# Patient Record
Sex: Male | Born: 1935
Health system: Southern US, Community
[De-identification: ages and names within clinical notes are randomized; demographics above are authoritative.]

## PROBLEM LIST (undated history)

## (undated) DIAGNOSIS — IMO0002 Reserved for concepts with insufficient information to code with codable children: Secondary | ICD-10-CM

## (undated) DIAGNOSIS — H9190 Unspecified hearing loss, unspecified ear: Secondary | ICD-10-CM

## (undated) DIAGNOSIS — IMO0001 Reserved for inherently not codable concepts without codable children: Secondary | ICD-10-CM

## (undated) DIAGNOSIS — N183 Chronic kidney disease, stage 3 (moderate): Secondary | ICD-10-CM

## (undated) DIAGNOSIS — R911 Solitary pulmonary nodule: Secondary | ICD-10-CM

## (undated) DIAGNOSIS — N200 Calculus of kidney: Secondary | ICD-10-CM

## (undated) DIAGNOSIS — C44209 Unspecified malignant neoplasm of skin of left ear and external auricular canal: Secondary | ICD-10-CM

## (undated) DIAGNOSIS — H3530 Unspecified macular degeneration: Secondary | ICD-10-CM

## (undated) DIAGNOSIS — I10 Essential (primary) hypertension: Secondary | ICD-10-CM

## (undated) DIAGNOSIS — J449 Chronic obstructive pulmonary disease, unspecified: Secondary | ICD-10-CM

## (undated) DIAGNOSIS — N400 Enlarged prostate without lower urinary tract symptoms: Secondary | ICD-10-CM

## (undated) HISTORY — DX: Chronic kidney disease, stage 3 (moderate): N18.3

## (undated) HISTORY — DX: Chronic obstructive pulmonary disease, unspecified: J44.9

## (undated) HISTORY — DX: Solitary pulmonary nodule: R91.1

## (undated) HISTORY — DX: Unspecified malignant neoplasm of skin of left ear and external auricular canal: C44.209

## (undated) HISTORY — DX: Reserved for inherently not codable concepts without codable children: IMO0001

## (undated) HISTORY — PX: CATARACT EXTRACTION, BILATERAL: SHX1313

## (undated) HISTORY — DX: Calculus of kidney: N20.0

## (undated) HISTORY — PX: OTHER SURGICAL HISTORY: SHX169

## (undated) HISTORY — DX: Reserved for concepts with insufficient information to code with codable children: IMO0002

## (undated) HISTORY — DX: Unspecified hearing loss, unspecified ear: H91.90

## (undated) HISTORY — DX: Essential (primary) hypertension: I10

## (undated) HISTORY — DX: Benign prostatic hyperplasia without lower urinary tract symptoms: N40.0

## (undated) HISTORY — DX: Unspecified macular degeneration: H35.30

---

## 2000-07-14 HISTORY — PX: CYSTECTOMY: SUR359

## 2001-07-14 HISTORY — PX: PATELLA FRACTURE SURGERY: SHX735

## 2003-01-12 ENCOUNTER — Encounter: Payer: Self-pay | Admitting: Family Medicine

## 2004-02-12 ENCOUNTER — Encounter: Payer: Self-pay | Admitting: Family Medicine

## 2004-06-11 ENCOUNTER — Ambulatory Visit: Payer: Self-pay | Admitting: Family Medicine

## 2004-06-13 ENCOUNTER — Ambulatory Visit: Payer: Self-pay | Admitting: Family Medicine

## 2004-09-12 ENCOUNTER — Ambulatory Visit: Payer: Self-pay | Admitting: Family Medicine

## 2004-12-12 ENCOUNTER — Encounter: Payer: Self-pay | Admitting: Family Medicine

## 2004-12-12 LAB — CONVERTED CEMR LAB: PSA: 1.37 ng/mL

## 2004-12-13 ENCOUNTER — Ambulatory Visit: Payer: Self-pay | Admitting: Family Medicine

## 2004-12-24 ENCOUNTER — Ambulatory Visit: Payer: Self-pay | Admitting: Family Medicine

## 2004-12-28 ENCOUNTER — Inpatient Hospital Stay (HOSPITAL_COMMUNITY): Admission: EM | Admit: 2004-12-28 | Discharge: 2005-01-02 | Payer: Self-pay | Admitting: Emergency Medicine

## 2004-12-29 ENCOUNTER — Ambulatory Visit: Payer: Self-pay | Admitting: Endocrinology

## 2004-12-29 HISTORY — PX: OTHER SURGICAL HISTORY: SHX169

## 2005-01-01 ENCOUNTER — Ambulatory Visit: Payer: Self-pay | Admitting: Cardiology

## 2005-01-01 ENCOUNTER — Encounter: Payer: Self-pay | Admitting: Cardiology

## 2005-01-03 ENCOUNTER — Emergency Department (HOSPITAL_COMMUNITY): Admission: EM | Admit: 2005-01-03 | Discharge: 2005-01-03 | Payer: Self-pay | Admitting: Emergency Medicine

## 2005-01-04 ENCOUNTER — Emergency Department (HOSPITAL_COMMUNITY): Admission: EM | Admit: 2005-01-04 | Discharge: 2005-01-04 | Payer: Self-pay | Admitting: Emergency Medicine

## 2005-01-24 HISTORY — PX: TRANSURETHRAL RESECTION OF PROSTATE: SHX73

## 2005-01-27 ENCOUNTER — Encounter (INDEPENDENT_AMBULATORY_CARE_PROVIDER_SITE_OTHER): Payer: Self-pay | Admitting: *Deleted

## 2005-01-27 ENCOUNTER — Inpatient Hospital Stay (HOSPITAL_COMMUNITY): Admission: RE | Admit: 2005-01-27 | Discharge: 2005-01-29 | Payer: Self-pay | Admitting: Urology

## 2005-02-08 ENCOUNTER — Emergency Department (HOSPITAL_COMMUNITY): Admission: EM | Admit: 2005-02-08 | Discharge: 2005-02-08 | Payer: Self-pay | Admitting: Emergency Medicine

## 2005-06-13 ENCOUNTER — Ambulatory Visit: Payer: Self-pay | Admitting: Family Medicine

## 2005-06-17 ENCOUNTER — Ambulatory Visit: Payer: Self-pay | Admitting: Family Medicine

## 2005-07-10 ENCOUNTER — Ambulatory Visit: Payer: Self-pay | Admitting: Family Medicine

## 2005-07-16 ENCOUNTER — Ambulatory Visit: Payer: Self-pay | Admitting: Cardiology

## 2005-09-15 ENCOUNTER — Ambulatory Visit: Payer: Self-pay | Admitting: Family Medicine

## 2005-12-11 ENCOUNTER — Ambulatory Visit: Payer: Self-pay | Admitting: Family Medicine

## 2005-12-15 ENCOUNTER — Ambulatory Visit: Payer: Self-pay | Admitting: Family Medicine

## 2006-03-04 ENCOUNTER — Ambulatory Visit: Payer: Self-pay | Admitting: Family Medicine

## 2006-07-14 ENCOUNTER — Encounter: Payer: Self-pay | Admitting: Family Medicine

## 2006-07-27 ENCOUNTER — Ambulatory Visit: Payer: Self-pay | Admitting: Family Medicine

## 2006-07-27 LAB — CONVERTED CEMR LAB
Albumin: 3.4 g/dL — ABNORMAL LOW (ref 3.5–5.2)
BUN: 31 mg/dL — ABNORMAL HIGH (ref 6–23)
CO2: 30 meq/L (ref 19–32)
Chloride: 110 meq/L (ref 96–112)
Chol/HDL Ratio, serum: 3.1
Cholesterol: 112 mg/dL (ref 0–200)
Creatinine, Ser: 1.6 mg/dL — ABNORMAL HIGH (ref 0.4–1.5)
GFR calc non Af Amer: 44 mL/min
Glomerular Filtration Rate, Af Am: 54 mL/min/{1.73_m2}
Glucose, Bld: 104 mg/dL — ABNORMAL HIGH (ref 70–99)
LDL Cholesterol: 63 mg/dL (ref 0–99)
PSA: 2.01 ng/mL (ref 0.10–4.00)
Potassium: 4.8 meq/L (ref 3.5–5.1)
TSH: 1.06 microintl units/mL (ref 0.35–5.50)
Total Bilirubin: 0.8 mg/dL (ref 0.3–1.2)

## 2006-07-29 ENCOUNTER — Ambulatory Visit: Payer: Self-pay | Admitting: Family Medicine

## 2006-08-18 ENCOUNTER — Ambulatory Visit: Payer: Self-pay | Admitting: Family Medicine

## 2006-08-19 HISTORY — PX: CYSTOSCOPY: SUR368

## 2007-01-18 IMAGING — CR DG ABDOMEN ACUTE W/ 1V CHEST
3 series · 3 of 3 positions shown · non-contrast
Comparison: none

CLINICAL DATA: Abdominal pain, vomiting.  
CHEST AND ACUTE ABDOMEN SERIES:
Chest:  A single view of the chest shows opacity at the right lung base.  Some of this could be chronic but pneumonia cannot be excluded and a followup chest x-ray is recommended.  The lungs are hyperaerated, consistent with COPD.  Biapical pleural thickening is noted.  The heart is mildly enlarged.  Supine and erect views of the abdomen show large and small bowel gas to be present most consistent with ileus.  There is a moderate amount of feces in the right colon.  No free intraperitoneal air is seen.  The bones are osteopenic.  There are calcifications of the right abdomen, which may represent right renal calculi.

[w chest pa]
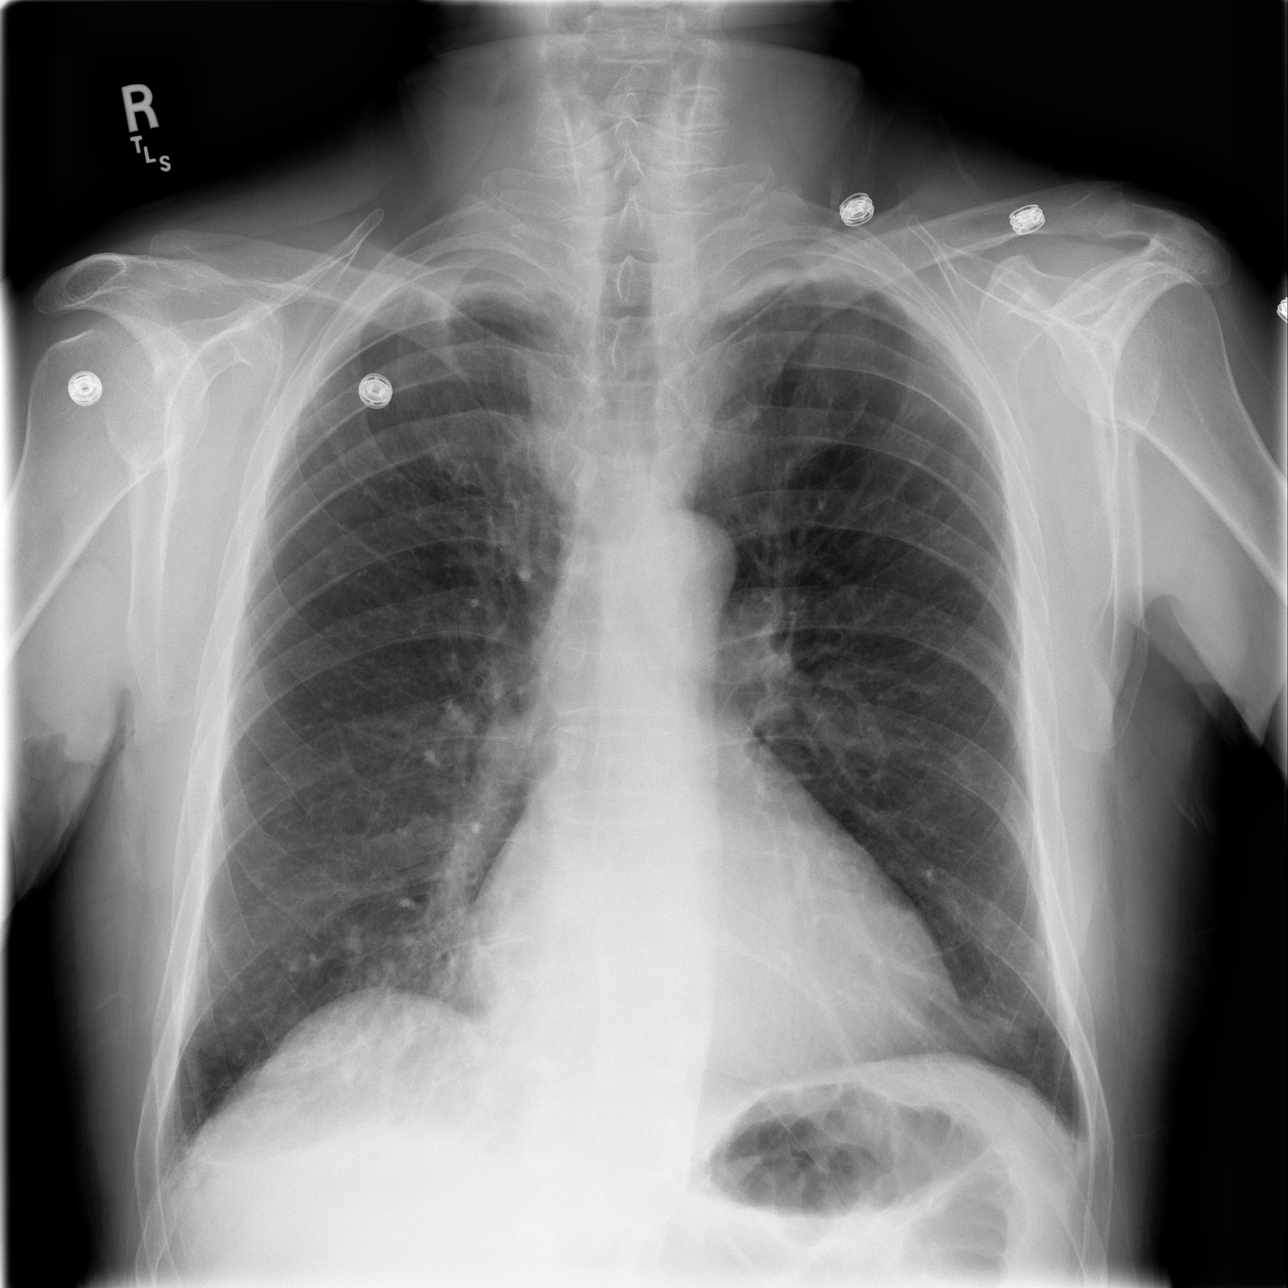

[w abdomen upright *]
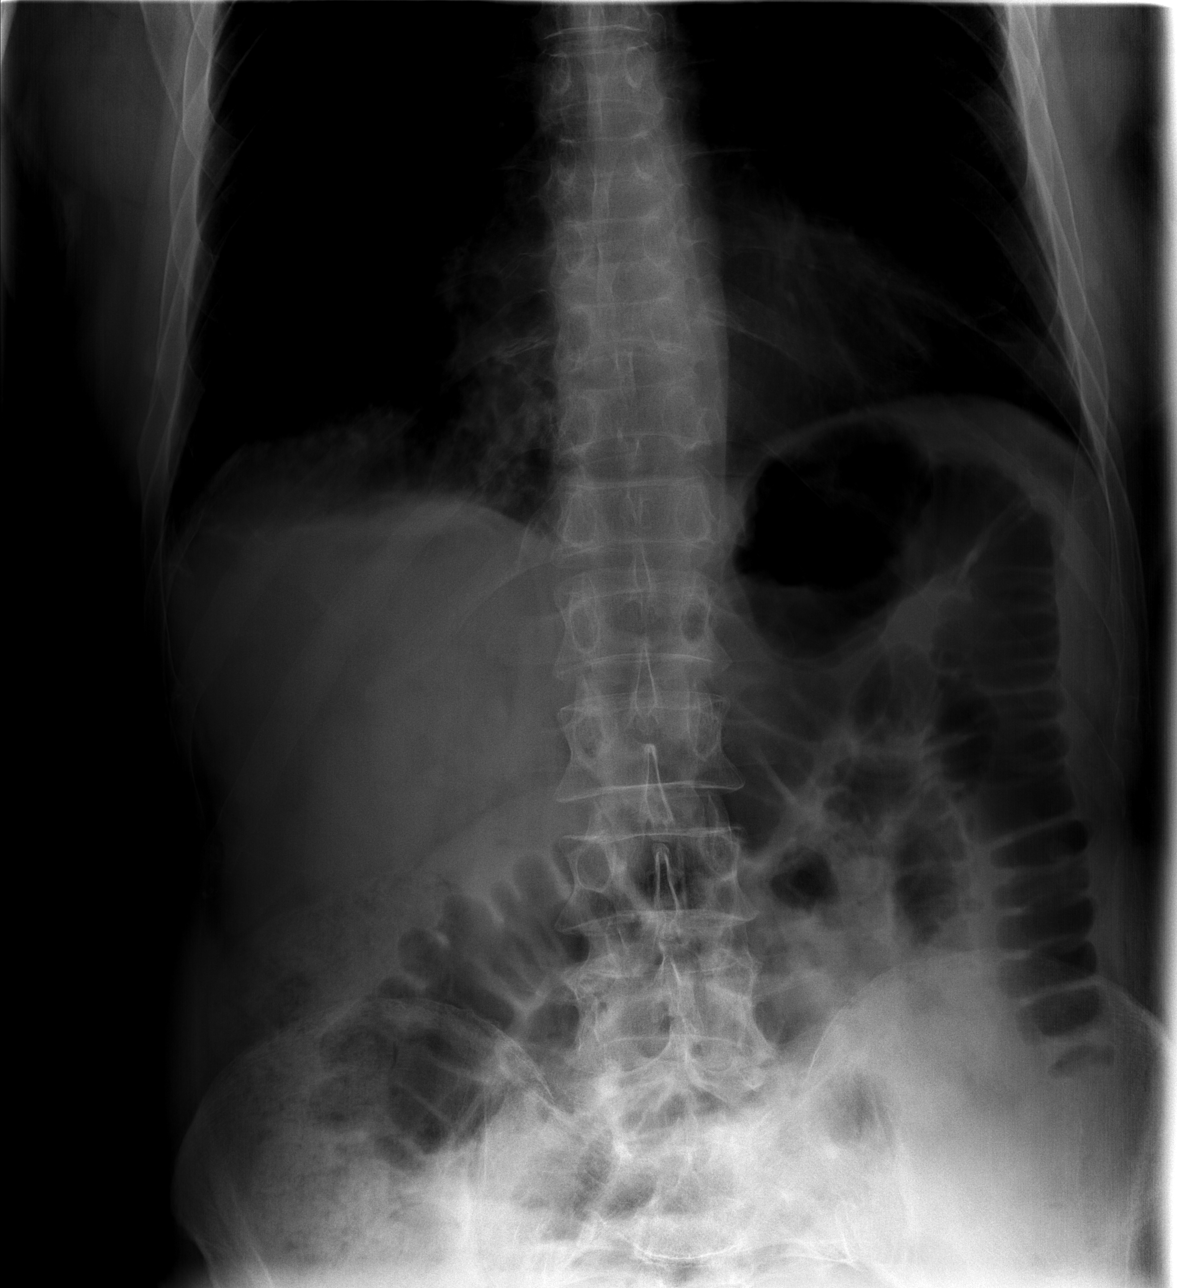

[t abdomen supine]
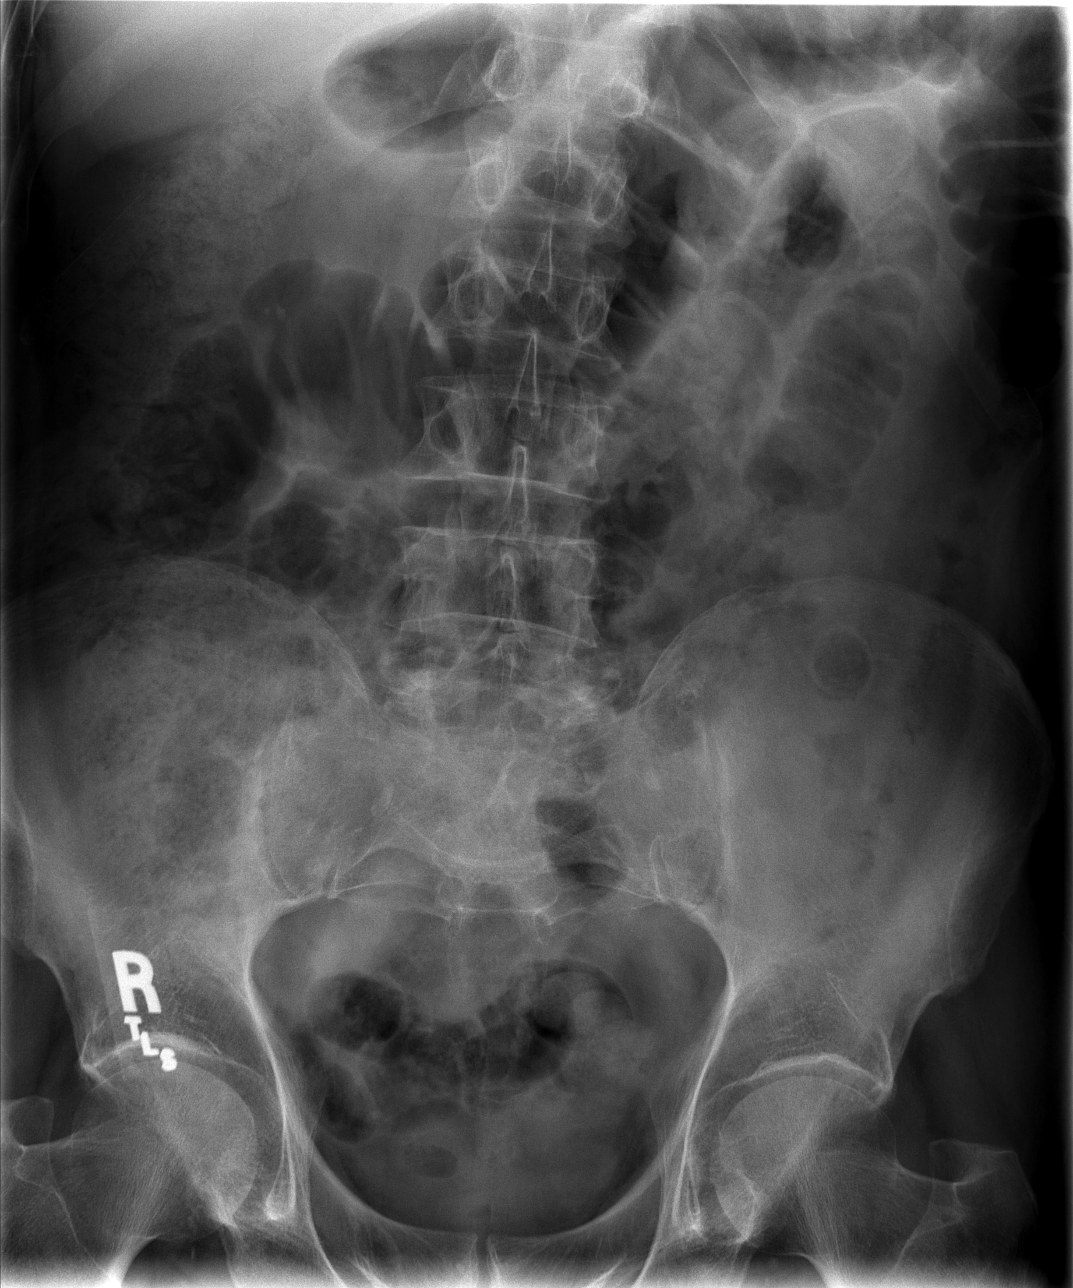

[3 of 3 positions shown; findings below may reference images not displayed]

IMPRESSION: 1.  Opacity at the right lung base in patient with COPD.  Possible pneumonia.  Suggest followup chest x-ray.
2.  Mild ileus.  No obstruction or free air.  
3.  Question of right renal calculi.

## 2007-01-18 IMAGING — CT CT ABDOMEN W/O CM
1 of 2 series · 15 of 32 positions shown, 19 images · IV contrast (agent unspecified)
Comparison: none

CLINICAL DATA: Abdominal pain, nausea.
 ABDOMEN CT WITHOUT CONTRAST:
TECHNIQUE: Multidetector CT imaging of the abdomen was performed following the standard protocol without IV contrast.  
 There is patchy opacity at the right lung base.  Pneumonia is a definite consideration.  No effusion is seen.  The liver appears normal in the unenhanced state.  No calcified gallstones are seen.  The pancreas is normal with normal peripancreatic fat planes.  The adrenal glands and spleen appear normal.  There are several small right lower pole renal calculi.  There is slight fullness to the pelvicaliceal system with minimally prominent ureters into the pelvis. Ct of the pelvis will be performed.  No adenopathy is seen. The abdominal aorta is normal in caliber.
TECHNIQUE: Multidetector CT imaging of the pelvis was performed following the standard protocol without IV contrast. 
 Scans were continued through the pelvis after only oral contrast was given.  The ureters remain slightly prominent to the bladder.  The prostate is moderately enlarged and the bladder is decompressed with Foley catheter.  No distal ureteral calculi are noted.

[Series 2: abd pelvis · axial · 0.79mm/px · z∈[-539,-119]mm · 15 of 92 slices shown, 19 images]
[im 4/92  soft-tissue]
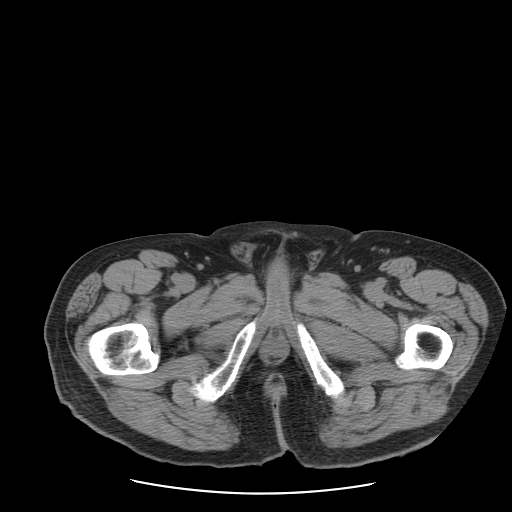
[im 4/92  bone]
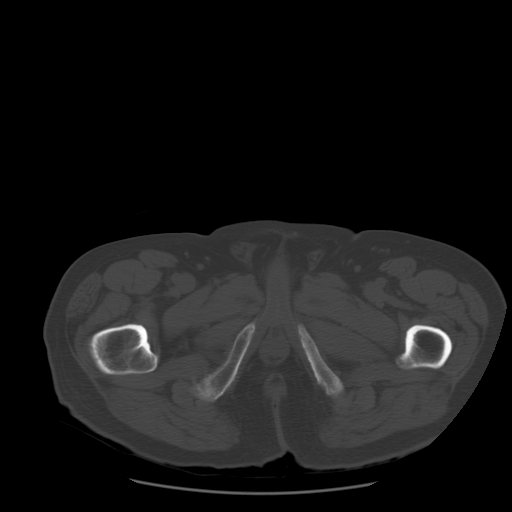
[im 11/92  soft-tissue]
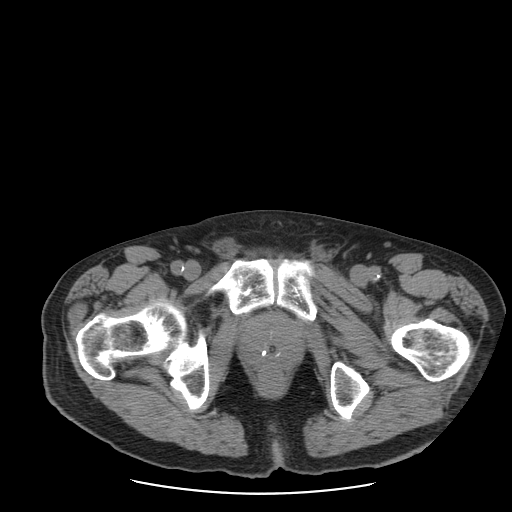
[im 19/92  soft-tissue]
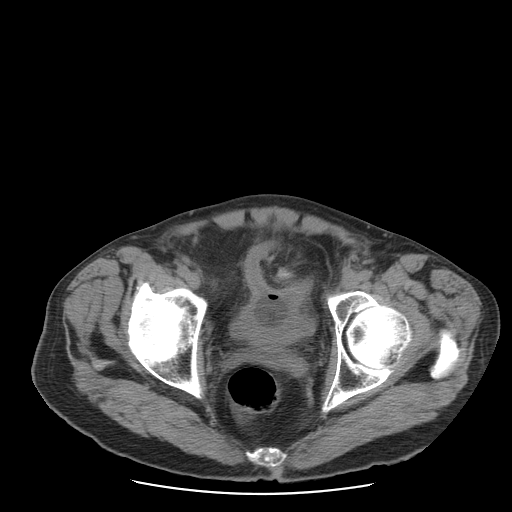
[im 26/92  soft-tissue]
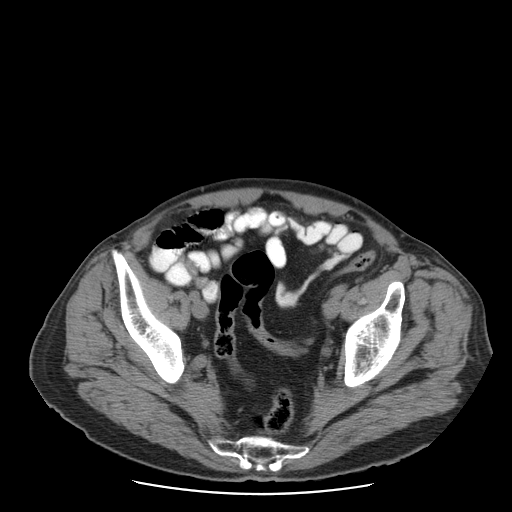
[im 33/92  soft-tissue]
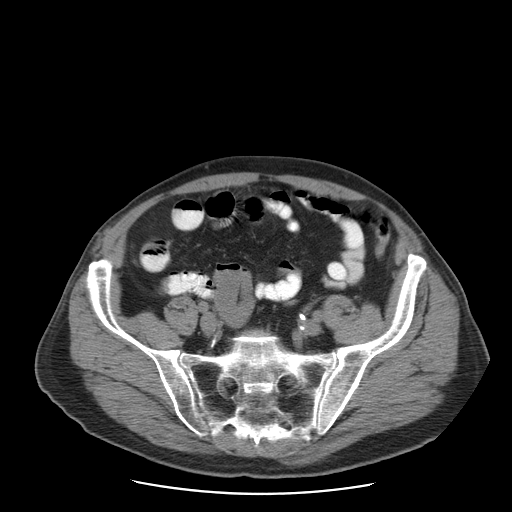
[im 41/92  soft-tissue]
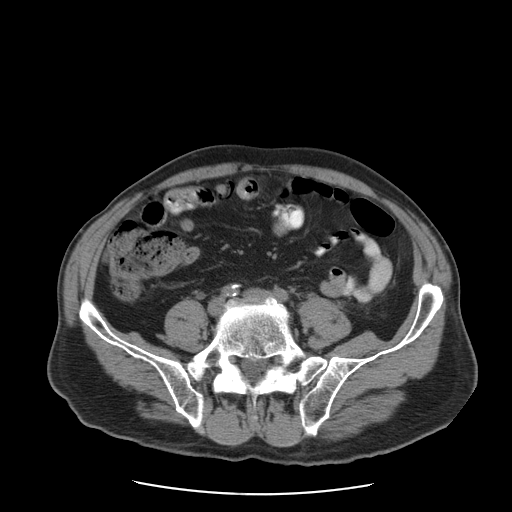
[im 48/92  soft-tissue]
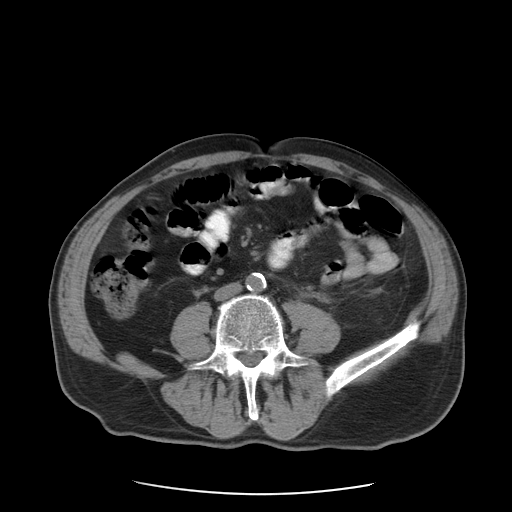
[im 51/92  soft-tissue]
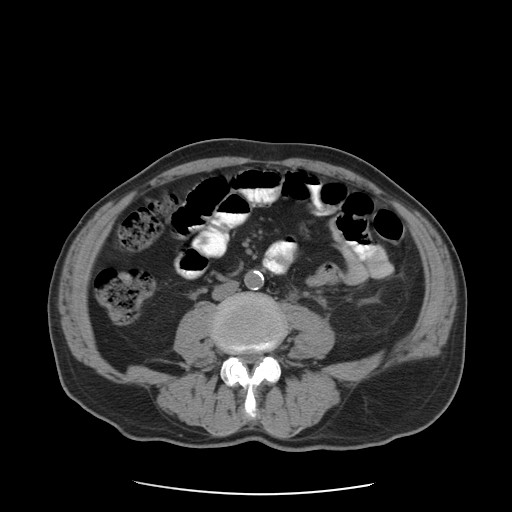
[im 59/92  soft-tissue]
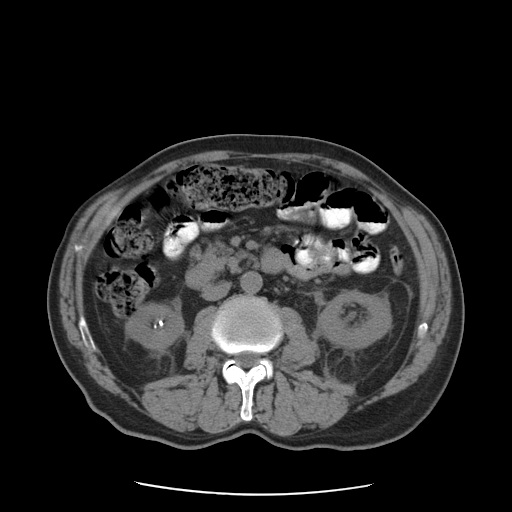
[im 59/92  bone]
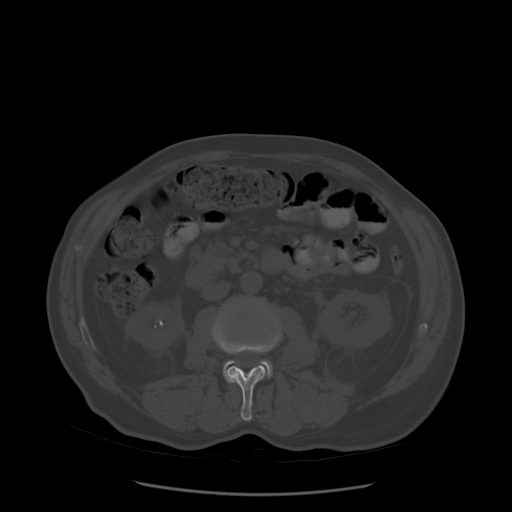
[im 66/92  soft-tissue]
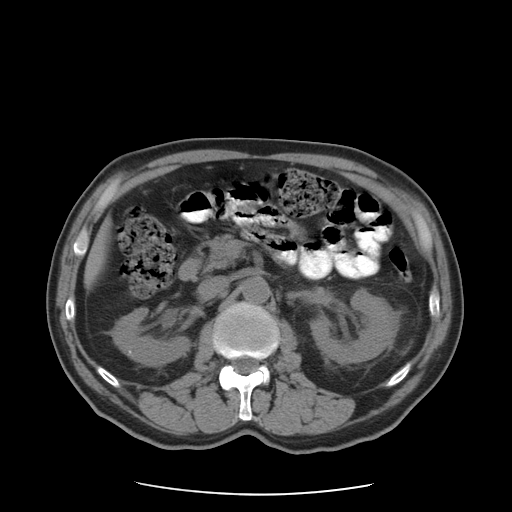
[im 73/92  soft-tissue]
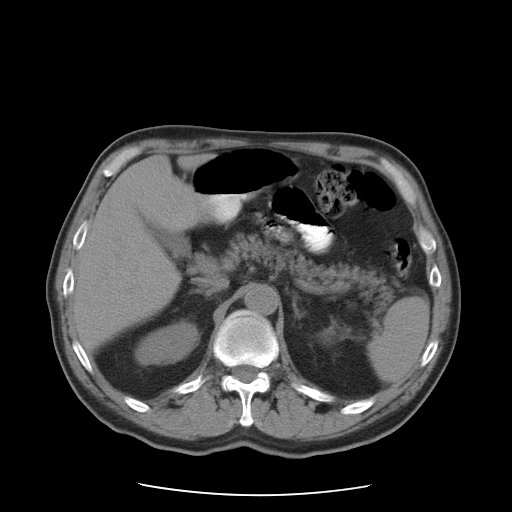
[im 77/92  lung]
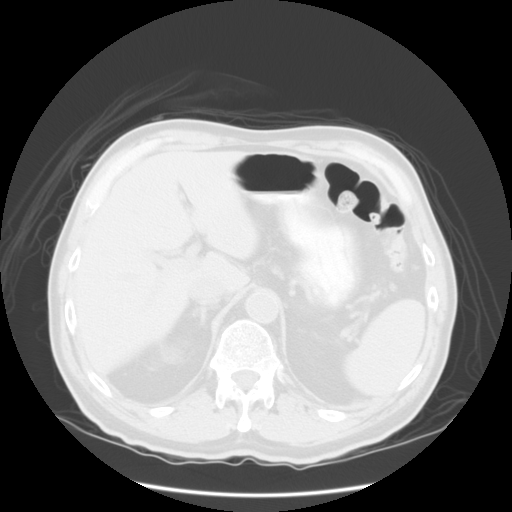
[im 81/92  soft-tissue]
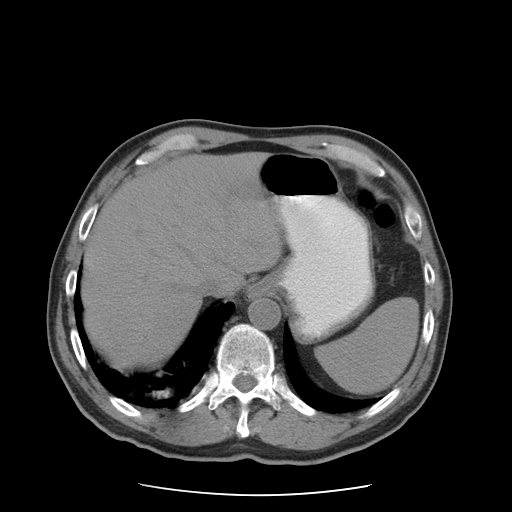
[im 81/92  lung]
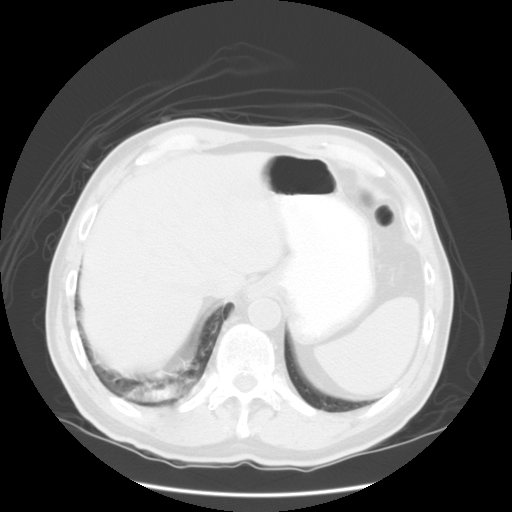
[im 84/92  lung]
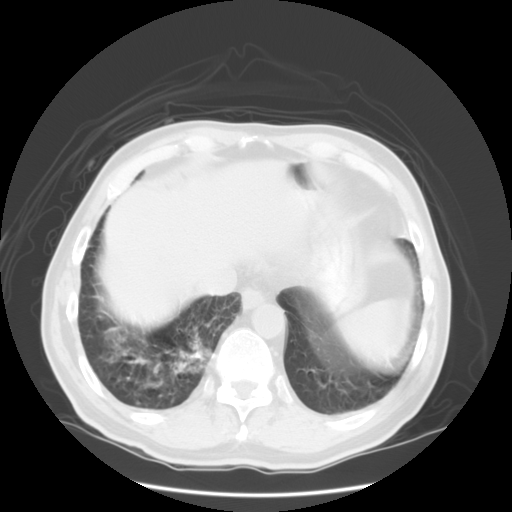
[im 88/92  soft-tissue]
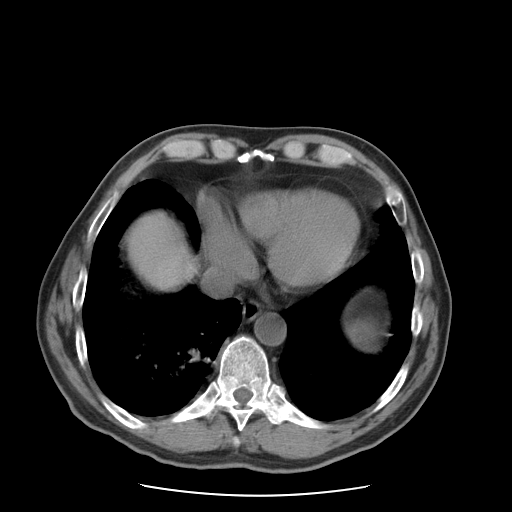
[im 88/92  lung]
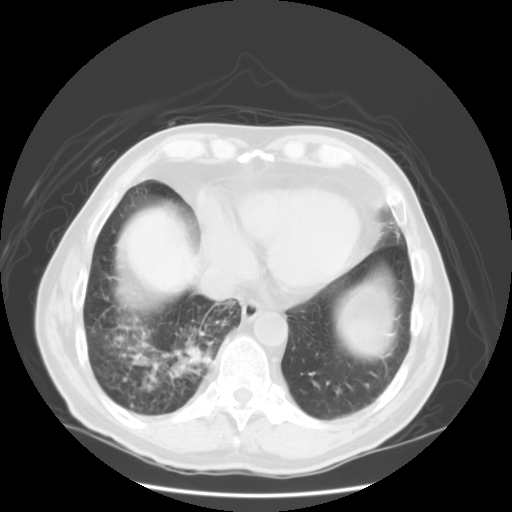

[15 of 32 positions shown; findings below may reference images not displayed]

IMPRESSION: 1.  Several small right lower pole renal calculi.  Mild fullness to the pelvicaliceal systems and ureters.  CT pelvis will be performed.
 2.  Patchy opacity in right lower lobe.  Possible pneumonia.  
 PELVIS CT WITHOUT CONTRAST:
IMPRESSION: No distal ureteral calculi noted.  The prostate is moderately enlarged.  The bladder is decompressed with Foley catheter.

## 2007-01-22 ENCOUNTER — Encounter: Payer: Self-pay | Admitting: Family Medicine

## 2007-01-22 DIAGNOSIS — I10 Essential (primary) hypertension: Secondary | ICD-10-CM

## 2007-01-22 IMAGING — CR DG CHEST 1V PORT
1 series · 1 of 1 positions shown · non-contrast
Comparison: none

CLINICAL DATA: Pneumonia

Portable chest at 607:
Comparison 12/28/2004. Some increase in patchy interstitial and airspace
infiltrates in the lower lobes, right greater than left. Heart size mildly
enlarged as before. No definite effusion although the lateral costophrenic
angles are excluded.

[view not recorded]
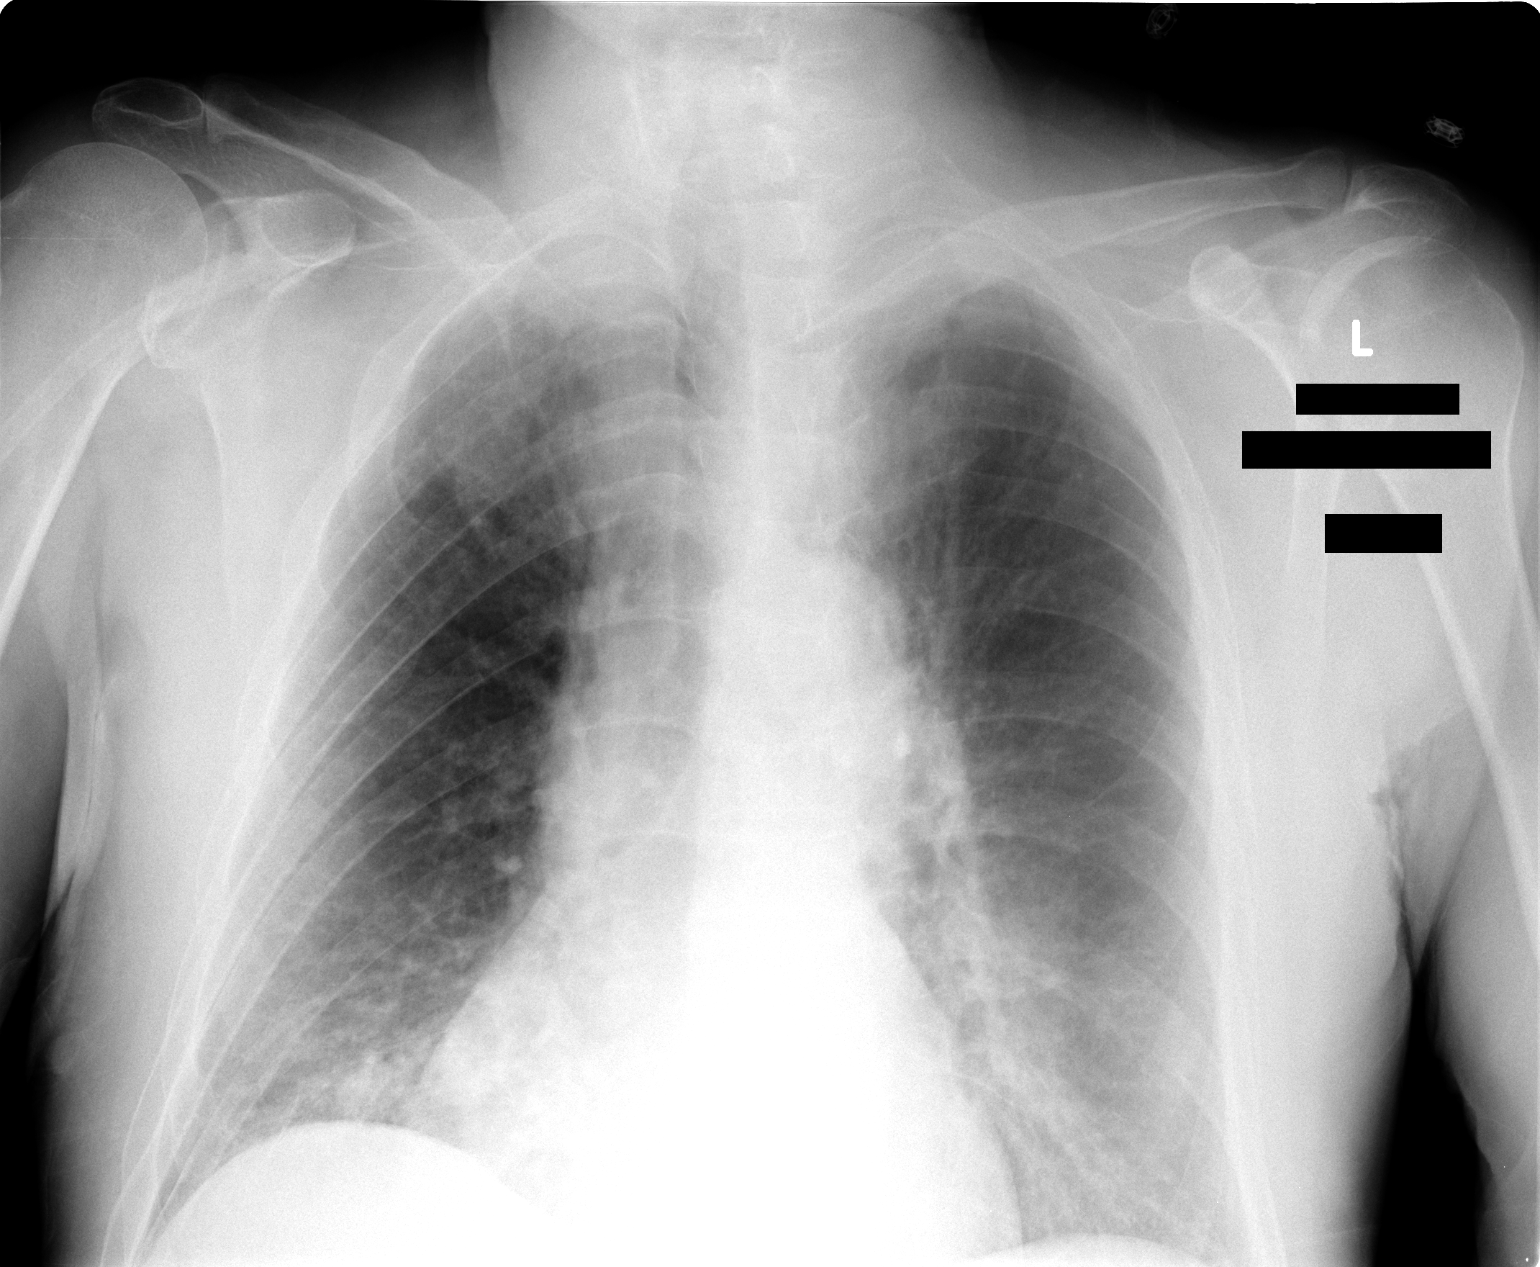

[1 of 1 positions shown; findings below may reference images not displayed]

IMPRESSION: 1. Slight worsening of bibasilar opacities, right greater than left

## 2007-01-27 ENCOUNTER — Ambulatory Visit: Payer: Self-pay | Admitting: Family Medicine

## 2007-01-27 DIAGNOSIS — N184 Chronic kidney disease, stage 4 (severe): Secondary | ICD-10-CM

## 2007-01-27 DIAGNOSIS — N183 Chronic kidney disease, stage 3 unspecified: Secondary | ICD-10-CM

## 2007-01-27 HISTORY — DX: Chronic kidney disease, stage 3 unspecified: N18.30

## 2007-01-28 LAB — CONVERTED CEMR LAB
GFR calc Af Amer: 54 mL/min
GFR calc non Af Amer: 44 mL/min
Glucose, Bld: 138 mg/dL — ABNORMAL HIGH (ref 70–99)
Potassium: 4.7 meq/L (ref 3.5–5.1)
Sodium: 142 meq/L (ref 135–145)

## 2007-07-26 ENCOUNTER — Ambulatory Visit: Payer: Self-pay | Admitting: Family Medicine

## 2007-07-26 LAB — CONVERTED CEMR LAB
ALT: 17 units/L (ref 0–53)
AST: 18 units/L (ref 0–37)
Albumin: 3.8 g/dL (ref 3.5–5.2)
Alkaline Phosphatase: 75 units/L (ref 39–117)
Creatinine, Ser: 1.6 mg/dL — ABNORMAL HIGH (ref 0.4–1.5)
Creatinine,U: 101.9 mg/dL
GFR calc non Af Amer: 44 mL/min
Glucose, Bld: 109 mg/dL — ABNORMAL HIGH (ref 70–99)
HDL: 32.9 mg/dL — ABNORMAL LOW (ref >39.0)
LDL Cholesterol: 69 mg/dL (ref 0–99)
Sodium: 143 meq/L (ref 135–145)
Total Bilirubin: 1.1 mg/dL (ref 0.3–1.2)
Total CHOL/HDL Ratio: 3.6

## 2007-07-28 ENCOUNTER — Ambulatory Visit: Payer: Self-pay | Admitting: Family Medicine

## 2007-08-17 ENCOUNTER — Ambulatory Visit: Payer: Self-pay | Admitting: Family Medicine

## 2007-08-18 ENCOUNTER — Encounter (INDEPENDENT_AMBULATORY_CARE_PROVIDER_SITE_OTHER): Payer: Self-pay | Admitting: *Deleted

## 2007-08-26 ENCOUNTER — Encounter: Payer: Self-pay | Admitting: Family Medicine

## 2007-10-21 ENCOUNTER — Ambulatory Visit: Payer: Self-pay | Admitting: Family Medicine

## 2007-10-21 LAB — CONVERTED CEMR LAB
Calcium: 9.2 mg/dL (ref 8.4–10.5)
Creatinine, Ser: 1.5 mg/dL (ref 0.4–1.5)
GFR calc non Af Amer: 48 mL/min
Glucose, Bld: 107 mg/dL — ABNORMAL HIGH (ref 70–99)

## 2007-10-25 ENCOUNTER — Ambulatory Visit: Payer: Self-pay | Admitting: Family Medicine

## 2007-11-05 ENCOUNTER — Ambulatory Visit: Payer: Self-pay | Admitting: Family Medicine

## 2007-11-09 ENCOUNTER — Ambulatory Visit: Payer: Self-pay | Admitting: Family Medicine

## 2007-11-09 ENCOUNTER — Encounter: Payer: Self-pay | Admitting: Family Medicine

## 2007-12-02 ENCOUNTER — Ambulatory Visit: Payer: Self-pay | Admitting: Family Medicine

## 2007-12-04 ENCOUNTER — Ambulatory Visit: Payer: Self-pay | Admitting: Family Medicine

## 2007-12-09 ENCOUNTER — Ambulatory Visit: Payer: Self-pay | Admitting: Family Medicine

## 2007-12-23 ENCOUNTER — Ambulatory Visit: Payer: Self-pay | Admitting: Family Medicine

## 2008-01-18 ENCOUNTER — Encounter: Payer: Self-pay | Admitting: Family Medicine

## 2008-07-12 ENCOUNTER — Ambulatory Visit: Payer: Self-pay | Admitting: Family Medicine

## 2008-07-12 LAB — CONVERTED CEMR LAB
ALT: 15 units/L (ref 0–53)
Albumin: 3.7 g/dL (ref 3.5–5.2)
Bilirubin, Direct: 0.1 mg/dL (ref 0.0–0.3)
Calcium: 9.2 mg/dL (ref 8.4–10.5)
GFR calc Af Amer: 57 mL/min
GFR calc non Af Amer: 48 mL/min
LDL Cholesterol: 75 mg/dL (ref 0–99)
Microalb Creat Ratio: 17.7 mg/g (ref 0.0–30.0)
PSA: 1.48 ng/mL (ref 0.10–4.00)
Potassium: 4.9 meq/L (ref 3.5–5.1)
Sodium: 141 meq/L (ref 135–145)
Total CHOL/HDL Ratio: 3.3
Total Protein: 6.6 g/dL (ref 6.0–8.3)

## 2008-07-15 LAB — CONVERTED CEMR LAB: Vit D, 1,25-Dihydroxy: 32 (ref 30–89)

## 2008-07-17 ENCOUNTER — Ambulatory Visit: Payer: Self-pay | Admitting: Family Medicine

## 2008-08-04 ENCOUNTER — Encounter (INDEPENDENT_AMBULATORY_CARE_PROVIDER_SITE_OTHER): Payer: Self-pay | Admitting: *Deleted

## 2008-08-04 ENCOUNTER — Ambulatory Visit: Payer: Self-pay | Admitting: Family Medicine

## 2008-08-04 LAB — CONVERTED CEMR LAB: OCCULT 2: NEGATIVE

## 2009-02-08 ENCOUNTER — Ambulatory Visit: Payer: Self-pay | Admitting: Family Medicine

## 2009-02-28 ENCOUNTER — Encounter: Payer: Self-pay | Admitting: Family Medicine

## 2009-05-22 ENCOUNTER — Ambulatory Visit: Payer: Self-pay | Admitting: Family Medicine

## 2009-07-11 ENCOUNTER — Ambulatory Visit: Payer: Self-pay | Admitting: Family Medicine

## 2009-07-11 LAB — CONVERTED CEMR LAB
Alkaline Phosphatase: 63 units/L (ref 39–117)
Bilirubin, Direct: 0.1 mg/dL (ref 0.0–0.3)
Creatinine, Ser: 1.4 mg/dL (ref 0.4–1.5)
Creatinine,U: 98.5 mg/dL
GFR calc non Af Amer: 51.27 mL/min (ref >60)
Glucose, Bld: 98 mg/dL (ref 70–99)
HCT: 44.7 % (ref 39.0–52.0)
Hemoglobin: 14.8 g/dL (ref 13.0–17.0)
MCHC: 33 g/dL (ref 30.0–36.0)
Microalb, Ur: 2.1 mg/dL — ABNORMAL HIGH (ref 0.0–1.9)
Monocytes Absolute: 0.7 10*3/uL (ref 0.1–1.0)
Monocytes Relative: 9.4 % (ref 3.0–12.0)
Neutro Abs: 4.7 10*3/uL (ref 1.4–7.7)
Neutrophils Relative %: 65.9 % (ref 43.0–77.0)
Platelets: 216 10*3/uL (ref 150.0–400.0)
Potassium: 4.6 meq/L (ref 3.5–5.1)
RBC: 4.68 M/uL (ref 4.22–5.81)
RDW: 12.7 % (ref 11.5–14.6)
Triglycerides: 83 mg/dL (ref 0.0–149.0)
VLDL: 16.6 mg/dL (ref 0.0–40.0)

## 2009-07-12 LAB — CONVERTED CEMR LAB: Vit D, 25-Hydroxy: 29 ng/mL — ABNORMAL LOW (ref 30–89)

## 2009-07-18 ENCOUNTER — Ambulatory Visit: Payer: Self-pay | Admitting: Family Medicine

## 2009-08-09 ENCOUNTER — Ambulatory Visit: Payer: Self-pay | Admitting: Family Medicine

## 2009-08-09 LAB — CONVERTED CEMR LAB: OCCULT 2: NEGATIVE

## 2009-08-13 ENCOUNTER — Encounter (INDEPENDENT_AMBULATORY_CARE_PROVIDER_SITE_OTHER): Payer: Self-pay | Admitting: *Deleted

## 2009-09-12 ENCOUNTER — Ambulatory Visit: Payer: Self-pay | Admitting: Family Medicine

## 2009-09-12 LAB — CONVERTED CEMR LAB
Calcium: 9.4 mg/dL (ref 8.4–10.5)
Creatinine, Ser: 1.4 mg/dL (ref 0.4–1.5)
GFR calc non Af Amer: 51.25 mL/min (ref >60)
Sodium: 143 meq/L (ref 135–145)

## 2009-09-17 ENCOUNTER — Ambulatory Visit: Payer: Self-pay | Admitting: Family Medicine

## 2009-10-29 ENCOUNTER — Ambulatory Visit: Payer: Self-pay | Admitting: Family Medicine

## 2009-10-30 LAB — CONVERTED CEMR LAB
BUN: 31 mg/dL — ABNORMAL HIGH (ref 6–23)
Chloride: 105 meq/L (ref 96–112)
Potassium: 4.6 meq/L (ref 3.5–5.1)
Sodium: 142 meq/L (ref 135–145)

## 2009-12-28 ENCOUNTER — Ambulatory Visit: Payer: Self-pay | Admitting: Family Medicine

## 2009-12-30 LAB — CONVERTED CEMR LAB
BUN: 33 mg/dL — ABNORMAL HIGH (ref 6–23)
Creatinine, Ser: 1.6 mg/dL — ABNORMAL HIGH (ref 0.4–1.5)

## 2010-01-01 ENCOUNTER — Ambulatory Visit: Payer: Self-pay | Admitting: Family Medicine

## 2010-01-31 ENCOUNTER — Ambulatory Visit: Payer: Self-pay | Admitting: Family Medicine

## 2010-02-01 LAB — CONVERTED CEMR LAB
Calcium: 9.4 mg/dL (ref 8.4–10.5)
Chloride: 111 meq/L (ref 96–112)
Creatinine, Ser: 1.5 mg/dL (ref 0.4–1.5)
GFR calc non Af Amer: 48.02 mL/min (ref >60)
Glucose, Bld: 114 mg/dL — ABNORMAL HIGH (ref 70–99)
Sodium: 145 meq/L (ref 135–145)

## 2010-02-13 ENCOUNTER — Encounter (INDEPENDENT_AMBULATORY_CARE_PROVIDER_SITE_OTHER): Payer: Self-pay | Admitting: *Deleted

## 2010-05-15 ENCOUNTER — Ambulatory Visit: Payer: Self-pay | Admitting: Family Medicine

## 2010-07-22 ENCOUNTER — Other Ambulatory Visit: Payer: Self-pay | Admitting: Family Medicine

## 2010-07-22 ENCOUNTER — Ambulatory Visit
Admission: RE | Admit: 2010-07-22 | Discharge: 2010-07-22 | Payer: Self-pay | Source: Home / Self Care | Attending: Family Medicine | Admitting: Family Medicine

## 2010-07-22 LAB — MICROALBUMIN / CREATININE URINE RATIO
Creatinine,U: 92.1 mg/dL
Microalb Creat Ratio: 1.2 mg/g (ref 0.0–30.0)
Microalb, Ur: 1.1 mg/dL (ref 0.0–1.9)

## 2010-07-22 LAB — LIPID PANEL
Cholesterol: 127 mg/dL (ref 0–200)
HDL: 38.3 mg/dL — ABNORMAL LOW (ref >39.00)
LDL Cholesterol: 79 mg/dL (ref 0–99)
Total CHOL/HDL Ratio: 3
Triglycerides: 51 mg/dL (ref 0.0–149.0)
VLDL: 10.2 mg/dL (ref 0.0–40.0)

## 2010-07-22 LAB — HEPATIC FUNCTION PANEL
ALT: 14 U/L (ref 0–53)
AST: 20 U/L (ref 0–37)
Albumin: 3.7 g/dL (ref 3.5–5.2)
Alkaline Phosphatase: 60 U/L (ref 39–117)
Bilirubin, Direct: 0.1 mg/dL (ref 0.0–0.3)
Total Bilirubin: 0.5 mg/dL (ref 0.3–1.2)
Total Protein: 6.7 g/dL (ref 6.0–8.3)

## 2010-07-22 LAB — RENAL FUNCTION PANEL
Albumin: 3.7 g/dL (ref 3.5–5.2)
BUN: 37 mg/dL — ABNORMAL HIGH (ref 6–23)
CO2: 27 mEq/L (ref 19–32)
Calcium: 9.6 mg/dL (ref 8.4–10.5)
Chloride: 108 mEq/L (ref 96–112)
Creatinine, Ser: 1.7 mg/dL — ABNORMAL HIGH (ref 0.4–1.5)
GFR: 40.06 mL/min — ABNORMAL LOW (ref >60.00)
Glucose, Bld: 100 mg/dL — ABNORMAL HIGH (ref 70–99)
Phosphorus: 2.8 mg/dL (ref 2.3–4.6)
Potassium: 4.7 mEq/L (ref 3.5–5.1)
Sodium: 143 mEq/L (ref 135–145)

## 2010-07-22 LAB — TSH: TSH: 1.45 u[IU]/mL (ref 0.35–5.50)

## 2010-07-24 ENCOUNTER — Telehealth: Payer: Self-pay | Admitting: Family Medicine

## 2010-07-24 ENCOUNTER — Ambulatory Visit
Admission: RE | Admit: 2010-07-24 | Discharge: 2010-07-24 | Payer: Self-pay | Source: Home / Self Care | Attending: Family Medicine | Admitting: Family Medicine

## 2010-08-15 NOTE — Letter (Signed)
Summary: [facility] letter  [facility] at [facility]  [address] [place], [place] [address]   Phone: [phone]  Fax: [phone]       02/13/2010 MRN: [id number]  [facility] [address] [place], [place]  [address]  Dear [facility] [place], and [facility] announce the retirement of [name], M.D., from full-time practice at the [facility] office effective January 10, 2010 and his plans of returning part-time.  It is important to Dr. [name] and to our practice that you understand that [facility] has seven physicians in our office for your health care needs.  We will continue to offer the same exceptional care that you have today.    Dr. [name] has spoken to many of you about his plans for retirement and returning part-time in the fall.   We will continue to work with you through the transition to schedule appointments for you in the office and meet the high standards that [facility] is committed to.   Again, it is with great pleasure that we share the news that Dr. [name] will return to [facility] at [facility] in October of 2011 with a reduced schedule.    If you have any questions, or would like to request an appointment with one of our physicians, please call us at [phone] and press the option for Scheduling an appointment.  We take pleasure in providing you with excellent patient care and look forward to seeing you at your next office visit.  Our [facility] Physicians are:  [name], M.D. [name], M.D. [name], M.D. [name], M.D. [name], M.D. [name], M.D. We proudly welcomed [name], M.D. and [name], M.D. to the practice in July/August 2011.  Sincerely,  [place]

## 2010-08-15 NOTE — Assessment & Plan Note (Signed)
Summary: CPX / LFW   Vital Signs:  Patient profile:   77 year old male Weight:      180 pounds Temp:     97.8 degrees F oral Pulse rate:   64 / minute Pulse rhythm:   irregular BP sitting:   160 / 64  (left arm) Cuff size:   regular  Vitals Entered By: Sydell Axon LPN July 31, 2009 9:31 AM) CC: 30 Minute checkup, hemoccult cards given to patient   History of Present Illness: Pt here with daughter for Comp Exam, is cold all the time.  Preventive Screening-Counseling & Management  Alcohol-Tobacco     Alcohol drinks/day: 0     Smoking Status: quit     Year Quit: 1974     Pack years: 45     Passive Smoke Exposure: no  Caffeine-Diet-Exercise     Caffeine use/day: 2     Does Patient Exercise: yes     Type of exercise: walking     Times/week: 7  Problems Prior to Update: 1)  Fracture, Forearm  (ICD-813.80) 2)  Neoplasm of Uncertain Behavior of Skin  (ICD-238.2) 3)  Pure Hypercholesterolemia  (ICD-272.0) 4)  Unspec Disorder Carbohydrate Transport&metab  (ICD-271.9) 5)  Renal Insufficiency  (ICD-588.9) 6)  Low Hdl  (ICD-272.5) 7)  Benign Prostatic Hypertrophy- No Further Psa (DR DAHLSTEDT)  (ICD-600.00) 8)  Fracture, Patella  (ICD-822.0) 9)  Hearing Loss, Bilateral  (ICD-389.9) 10)  Hyperglycemia  (ICD-790.29) 11)  Hypertension  (ICD-401.9)  Medications Prior to Update: 1)  Lisinopril 10 Mg  Tabs (Lisinopril) .Marland Kitchen.. 1 By Mouth Two Times A Day 2)  Amlodipine Besylate 5 Mg  Tabs (Amlodipine Besylate) .Marland Kitchen.. 1 By Mouth At Bedtime 3)  Tylenol Ex St Arthritis Pain 500 Mg Tabs (Acetaminophen) .... As Needed For Pain 4)  Trimox 500 Mg Caps (Amoxicillin) .... 2 Tabs By Mouth Two Times A Day  Allergies: No Known Drug Allergies  Past History:  Past Medical History: Last updated: 01/22/2007 Hypertension Benign prostatic hypertrophy  Past Surgical History: Last updated: 01/22/2007 2002         Cystectomy R Upper Arm (Dr Domenica Reamer) 2001/03    Catarract Surgery  R 2001  L  Laser 2003 2003         Fx Patella  Rehab 12/28/04     Abd CT - ? pneumonia, ? kidney stones 12/28/04     Pelvic CT, no stones, elev. prostate 6/17 - 01/02/05   [facility], ? pneumonia, urinary retention, gastrocnemia, elev. PSA, ARI 12/29/04     Echo EF 60%, no wall abnormality 01/24/05     TURP (Dahlstedt) 08/19/06       Flexible cystoscopy nonobstrutive  Family History: Last updated: 31-Jul-2009 Father: Died 61, CA skin in mouth Mother: Died 26, "heart trouble", arrhythmia Siblings: 3 brothers   1 died June 29, 2023 (Clifford) A. Fib, hernia surg., swelling of spinal cord   1 died 12/01/22 Velda Shell) CAD, dementia   1 alive 85 (Winfred) 1 sister died Educational psychologist) health  probs, CAD, DM Son dec Pneumonia after BM Transpl Non Hodgkinn's Lymphoma CV:  (+) Mother "heart trouble", brother with A.Fib. HBP:  (+) self =- in family CA:  Father, skin of mouth Depression:  ? self Stroke:  (-)  Social History: Last updated: 07/17/2008 Current Smoker, 1-1/2 PPD x 30 years - 63 PYH, quit '74 (30 years) Alcohol use-no Drug use-no Marital Status: Married, wife lives in [place]. 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 Fib, hernia surg., swelling of spinal cord   1 died 13-Dec-2022 Velda Shell) CAD, dementia   1 alive 64 (Winfred) 1 sister died Educational psychologist) health  probs, CAD, DM Son dec Pneumonia after BM Transpl Non Hodgkinn's Lymphoma CV:  (+) Mother "heart trouble", brother with A.Fib. HBP:  (+) self =- in family CA:  Father, skin of mouth Depression:  ?  self Stroke:  (-)  Review of Systems General:  Denies chills, fatigue, fever, sweats, weakness, and weight loss. Eyes:  Complains of blurring; denies discharge and eye pain; minimal change. ENT:  Denies ear discharge, earache, and ringing in ears. CV:  Denies chest pain or discomfort and palpitations. Resp:  Denies cough, shortness of breath, and wheezing. GI:  Denies abdominal pain, bloody stools, change in bowel habits, constipation, dark tarry stools, diarrhea, indigestion, loss of appetite, nausea, vomiting, vomiting blood, and yellowish skin color. GU:  Denies dysuria, hematuria, and nocturia. Derm:  Denies dryness, itching, and rash. Neuro:  Complains of tremors; denies numbness, poor balance, and tingling; slight.  Physical Exam  General:  Well-developed,well-nourished,in no acute distress; alert,appropriate and cooperative throughout examination, very hard of hearing. Head:  Normocephalic and atraumatic without obvious abnormalities. No apparent alopecia but mild balding. Sinuses NT. Eyes:  Conjunctiva clear bilaterally.  Ears:  External ear exam shows no significant lesions or deformities.  Otoscopic examination reveals clear canals, tympanic membranes are intact bilaterally without bulging, retraction, inflammation but appear thicker than normal/sclerotic.Marland Kitchen Hearing is poor at best bilat...has lost his hearing aids. TMs dull to LR. Nose:  External nasal examination shows no deformity or inflammation. Nasal mucosa are pink and moist without lesions or exudates. Mouth:  Oral mucosa and oropharynx without lesions or exudates.  Teeth in good repair. Slight PND. Neck:  No deformities, masses, or tenderness noted. Chest Wall:  No deformities, masses, tenderness or gynecomastia noted. Breasts:  No masses or gynecomastia noted Lungs:  Normal respiratory effort, chest expands symmetrically. Lungs are clear to auscultation, no crackles or wheezes. Heart:  Normal rate and regular rhythm. S1  and S2 normal without gallop, murmur, click, rub or other extra sounds. Abdomen:  Bowel sounds positive,abdomen soft and non-tender without masses, organomegaly or hernias noted. Rectal:  No external abnormalities noted. Normal sphincter tone. No rectal masses or tenderness. G neg. Genitalia:  Testes bilaterally descended without nodularity, tenderness or masses. No scrotal masses or lesions. No penis lesions or urethral discharge. Prostate:  Prostate gland firm and smooth, no enlargement, nodularity, tenderness, mass, asymmetry or induration. 20gms. Msk:  No deformity or scoliosis noted of thoracic or lumbar spine.   Pulses:  R and L carotid,radial,femoral,dorsalis pedis and posterior tibial pulses are full and equal bilaterally Extremities:  No clubbing, cyanosis, edema, or deformity noted with normal full range of motion of all joints.   Neurologic:  No cranial nerve deficits noted. Station and gait are normal.  Sensory, motor and coordinative functions appear intact. Skin:  Intact without suspicious lesions or rashes, plentiful AKs and SKs. Cervical Nodes:  No lymphadenopathy noted Inguinal Nodes:  No significant adenopathy Psych:  Cognition and judgment appear intact. Alert and  cooperative with normal attention span and concentration. No apparent delusions, illusions, hallucinations   Impression & Recommendations:  Problem # 1:  PURE HYPERCHOLESTEROLEMIA (ICD-272.0) Assessment Improved Excellent nos. Great diet and genetics. Labs Reviewed: SGOT: 21 (07/11/2009)   SGPT: 18 (07/11/2009)   HDL:39.80 (07/11/2009), 38.0 (07/12/2008)  LDL:76 (07/11/2009), 75 (07/12/2008)  Chol:132 (07/11/2009), 127 (07/12/2008)  Trig:83.0 (07/11/2009), 68 (07/12/2008)  Problem # 2:  RENAL INSUFFICIENCY (ICD-588.9) Assessment: Unchanged Stable. Will incr Lisinopril and follow.  Problem # 3:  FRACTURE, PATELLA (ICD-822.0) Assessment: Unchanged Has healed well, no complaints.  Problem # 4:  HEARING LOSS,  BILATERAL (ICD-389.9) Assessment: Unchanged Wears H/As bilat, diff to ascertain progression.  Problem # 5:  HYPERGLYCEMIA (ICD-790.29) Assessment: Improved  Euglycemic....cont curr lifestyle.  Labs Reviewed: Creat: 1.4 (07/11/2009)     Problem # 6:  HYPERTENSION (ICD-401.9) Assessment: Deteriorated Still elevated. Will incr Lisinopril and watch renal function as well as recheck BP. His updated medication list for this problem includes:    Lisinopril 20 Mg Tabs (Lisinopril) ..... One tab by mouth two times a day    Amlodipine Besylate 5 Mg Tabs (Amlodipine besylate) .Marland Kitchen... 1 by mouth at bedtime  BP today: 160/64 Prior BP: 158/70 (05/22/2009)  Labs Reviewed: K+: 4.6 (07/11/2009) Creat: : 1.4 (07/11/2009)   Chol: 132 (07/11/2009)   HDL: 39.80 (07/11/2009)   LDL: 76 (07/11/2009)   TG: 83.0 (07/11/2009)  Complete Medication List: 1)  Lisinopril 20 Mg Tabs (Lisinopril) .... One tab by mouth two times a day 2)  Amlodipine Besylate 5 Mg Tabs (Amlodipine besylate) .Marland Kitchen.. 1 by mouth at bedtime 3)  Tylenol Ex St Arthritis Pain 500 Mg Tabs (Acetaminophen) .... As needed for pain  Patient Instructions: 1)  RTC 2 mos, BMet prior 401.9 2)  Increase Lisinopril to 20mg  bid. Monitor kidney function. 3)  Take Guaifenesin by going to [facility] or [facility] and getting MUCOUS RELIEF EXPECTORANT (400mg ), take 11/2 tabs by mouth AM and NOON. 4)  Drink lots of fluids anytime taking Guaifenesin.  Prescriptions: LISINOPRIL 20 MG TABS (LISINOPRIL) one tab by mouth two times a day  #60 x 12   Entered and Authorized by:   Shaune Leeks MD   Signed by:   Shaune Leeks MD on 07/18/2009   Method used:   Electronically to        [facility]* (retail)       [address] [place], [place]  [address]       Ph: [phone] or [phone]       Fax: [phone]   RxID:   [id number]   Current Allergies (reviewed  today): No known allergies

## 2010-08-15 NOTE — Progress Notes (Signed)
Summary: rx for shingles vaccine  Phone Note Call from Patient Call back at Home Phone [phone]   Caller: Patient Call For: Shaune Leeks MD Summary of Call: Patient is asking for rx be sent to [facility] pharmacy for him to have the shingles vaccine.  Initial call taken by: Melody Comas,  July 24, 2010 3:24 PM  Follow-up for Phone Call        Patient notified of rx.  Follow-up by: Melody Comas,  July 25, 2010 8:02 AM    New/Updated Medications: ZOSTAVAX 09811 UNT/0.65ML SOLR (ZOSTER VACCINE LIVE) administer Prescriptions: ZOSTAVAX [id number] UNT/0.65ML SOLR (ZOSTER VACCINE LIVE) administer  #1 x 0   Entered and Authorized by:   Shaune Leeks MD   Signed by:   Shaune Leeks MD on 07/24/2010   Method used:   Electronically to        [facility]* (retail)       [address]       [place], [place]  [address]       Ph: [phone]       Fax: [phone]   RxID:   [id number]

## 2010-08-15 NOTE — Assessment & Plan Note (Signed)
Summary: FOLLOW UP / LFW   Vital Signs:  Patient profile:   77 year old male Weight:      186.75 pounds Temp:     97.0 degrees F oral Pulse rate:   60 / minute Pulse rhythm:   regular BP sitting:   142 / 70  (left arm) Cuff size:   large  Vitals Entered By: Sydell Axon LPN (May 15, 2010 2:27 PM) CC: follow-up visit   History of Present Illness: Ronnie Brown. He has no complaints. He feels well and is doing great. He scheduled this appt himself so thoughts and memory appeat good. Hearing is abysmal but has been for some time. He continues to avoid bananas and drink fluids.   Problems Prior to Update: 1)  Hyperkalemia  (ICD-276.7) 2)  Special Screening Malignant Neoplasm of Prostate  (ICD-V76.44) 3)  Fracture, Forearm  (ICD-813.80) 4)  Neoplasm of Uncertain Behavior of Skin  (ICD-238.2) 5)  Pure Hypercholesterolemia  (ICD-272.0) 6)  Unspec Disorder Carbohydrate Transport&metab  (ICD-271.9) 7)  Renal Insufficiency  (ICD-588.9) 8)  Low Hdl  (ICD-272.5) 9)  Benign Prostatic Hypertrophy- No Further Psa (DR DAHLSTEDT)  (ICD-600.00) 10)  Fracture, Patella  (ICD-822.0) 11)  Hearing Loss, Bilateral  (ICD-389.9) 12)  Hyperglycemia  (ICD-790.29) 13)  Hypertension  (ICD-401.9)  Medications Prior to Update: 1)  Lisinopril-Hydrochlorothiazide 20-12.5 Mg Tabs (Lisinopril-Hydrochlorothiazide) .... One Tab By Mouth in Am. 2)  Amlodipine Besylate 5 Mg  Tabs (Amlodipine Besylate) .Marland Kitchen.. 1 By Mouth At Bedtime 3)  Tylenol Ex St Arthritis Pain 500 Mg Tabs (Acetaminophen) .... As Needed For Pain 4)  Vitamin D 1000 Unit Tabs (Cholecalciferol) .... Take One By Mouth Daily  Allergies: No Known Drug Allergies  Physical Exam  General:  Well-developed,well-nourished,in no acute distress; alert,appropriate and cooperative throughout examination Head:  Normocephalic and atraumatic without obvious abnormalities. No apparent alopecia but mild balding. Sinuses NT. Eyes:  Conjunctiva clear  bilaterally.  Ears:  External ear exam shows no significant lesions or deformities.  Otoscopic examination reveals clear canals, tympanic membranes are intact bilaterally without bulging, retraction, inflammation but appear thicker than normal/sclerotic.Marland Kitchen Hearing is poor at best bilat... hearing aids in place bilat.. TMs dull to LR. Nose:  External nasal examination shows no deformity or inflammation. Nasal mucosa are pink and moist without lesions or exudates. Mouth:  Oral mucosa and oropharynx without lesions or exudates.  Teeth in good repair. Slight PND. Neck:  No deformities, masses, or tenderness noted. Chest Wall:  No deformities, masses, tenderness or gynecomastia noted. Lungs:  Normal respiratory effort, chest expands symmetrically. Lungs are clear to auscultation, no crackles or wheezes. Heart:  Normal rate and regular rhythm. S1 and S2 normal without gallop, murmur, click, rub or other extra sounds.   Impression & Recommendations:  Problem # 1:  HYPERTENSION (ICD-401.9) Assessment Deteriorated Will follow, has been pretty good the last few times here. Adjust next time if still elevated. His updated medication list for this problem includes:    Lisinopril-hydrochlorothiazide 20-12.5 Mg Tabs (Lisinopril-hydrochlorothiazide) ..... One tab by mouth in am.    Amlodipine Besylate 5 Mg Tabs (Amlodipine besylate) .Marland Kitchen... 1 by mouth at bedtime  BP today: 142/70 Prior BP: 120/70 (01/01/2010)  Labs Reviewed: K+: 4.6 (01/31/2010) Creat: : 1.5 (01/31/2010)   Chol: 132 (07/11/2009)   HDL: 39.80 (07/11/2009)   LDL: 76 (07/11/2009)   TG: 83.0 (07/11/2009)  Problem # 2:  HYPERKALEMIA (ICD-276.7) Assessment: Unchanged Recheck next time.  Problem # 3:  RENAL INSUFFICIENCY (ICD-588.9)  Assessment: Unchanged Recheck next time also.  Complete Medication List: 1)  Lisinopril-hydrochlorothiazide 20-12.5 Mg Tabs (Lisinopril-hydrochlorothiazide) .... One tab by mouth in am. 2)  Amlodipine Besylate  5 Mg Tabs (Amlodipine besylate) .Marland Kitchen.. 1 by mouth at bedtime 3)  Tylenol Ex St Arthritis Pain 500 Mg Tabs (Acetaminophen) .... As needed for pain 4)  Vitamin D 1000 Unit Tabs (Cholecalciferol) .... Take one by mouth daily  Patient Instructions: 1)  RTC for Comp Exam after 07/14/10., labs prior.   Orders Added: 1)  Est. Patient Level III [[address]]    Current Allergies (reviewed today): No known allergies   Appended Document: FOLLOW UP / LFW     Allergies: No Known Drug Allergies   Complete Medication List: 1)  Lisinopril-hydrochlorothiazide 20-12.5 Mg Tabs (Lisinopril-hydrochlorothiazide) .... One tab by mouth in am. 2)  Amlodipine Besylate 5 Mg Tabs (Amlodipine besylate) .Marland Kitchen.. 1 by mouth at bedtime 3)  Tylenol Ex St Arthritis Pain 500 Mg Tabs (Acetaminophen) .... As needed for pain 4)  Vitamin D 1000 Unit Tabs (Cholecalciferol) .... Take one by mouth daily  Other Orders: Flu Vaccine 63yrs + MEDICARE PATIENTS (U0454) Administration Flu vaccine - MCR (G0008)   Orders Added: 1)  Flu Vaccine 21yrs + MEDICARE PATIENTS [Q2039] 2)  Administration Flu vaccine - MCR [G0008]   Flu Vaccine Consent Questions     Do you have a history of severe allergic reactions to this vaccine? no    Any prior history of allergic reactions to egg and/or gelatin? no    Do you have a sensitivity to the preservative Thimersol? no    Do you have a past history of Guillan-Barre Syndrome? no    Do you currently have an acute febrile illness? no    Have you ever had a severe reaction to latex? no    Vaccine information given and explained to patient? yes    Are you currently pregnant? no    Lot Number:[id number]   Exp Date:01/11/2011   Site Given  Left Deltoid IM

## 2010-08-15 NOTE — Letter (Signed)
Summary: Results Follow up Letter  [place] [facility]  [address] [place], [place] [address]   Phone: [phone]  Fax: [phone]    08/13/2009 MRN: [id number]  Ronnie Brown [address] [place], [place]  [address]  Dear Mr. CAGGIANO,  The following are the results of your recent test(s):  Test         Result    Pap Smear:        Normal _____  Not Normal _____ Comments: ______________________________________________________ Cholesterol: LDL(Bad cholesterol):         Your goal is less than:         HDL (Good cholesterol):       Your goal is more than: Comments:  ______________________________________________________ Mammogram:        Normal _____  Not Normal _____ Comments:  ___________________________________________________________________ Hemoccult:        Normal _X____  Not normal _______ Comments:  Please repeat in one year.  _____________________________________________________________________ Other Tests:    We routinely do not discuss normal results over the telephone.  If you desire a copy of the results, or you have any questions about this information we can discuss them at your next office visit.   Sincerely,      [name], MD

## 2010-08-15 NOTE — Assessment & Plan Note (Signed)
Summary: JUNE FOLLOW UP/[facility]   Vital Signs:  Patient profile:   77 year old male Weight:      183.50 pounds BMI:     27.59 Temp:     98.0 degrees F oral Pulse rate:   60 / minute Pulse rhythm:   irregular BP sitting:   120 / 70  (left arm) Cuff size:   regular  Vitals Entered By: Sydell Axon LPN (January 01, 2010 10:20 AM) CC: June follow-up   History of Present Illness: Pt here with daughter for followup. Is doing well with no complaints. I increased BP meds a few mopnths ago and he seems to be tolerating them well.  He has renal insufficiency and rechecked that this tiome as well. His function has improved but K elevated. He eats one to two bananas a day.  Problems Prior to Update: 1)  Special Screening Malignant Neoplasm of Prostate  (ICD-V76.44) 2)  Fracture, Forearm  (ICD-813.80) 3)  Neoplasm of Uncertain Behavior of Skin  (ICD-238.2) 4)  Pure Hypercholesterolemia  (ICD-272.0) 5)  Unspec Disorder Carbohydrate Transport&metab  (ICD-271.9) 6)  Renal Insufficiency  (ICD-588.9) 7)  Low Hdl  (ICD-272.5) 8)  Benign Prostatic Hypertrophy- No Further Psa (DR DAHLSTEDT)  (ICD-600.00) 9)  Fracture, Patella  (ICD-822.0) 10)  Hearing Loss, Bilateral  (ICD-389.9) 11)  Hyperglycemia  (ICD-790.29) 12)  Hypertension  (ICD-401.9)  Medications Prior to Update: 1)  Lisinopril-Hydrochlorothiazide 20-12.5 Mg Tabs (Lisinopril-Hydrochlorothiazide) .... One Tab By Mouth in Am. 2)  Amlodipine Besylate 5 Mg  Tabs (Amlodipine Besylate) .Marland Kitchen.. 1 By Mouth At Bedtime 3)  Tylenol Ex St Arthritis Pain 500 Mg Tabs (Acetaminophen) .... As Needed For Pain 4)  Vitamin D 1000 Unit Tabs (Cholecalciferol) .... Take One By Mouth Daily  Allergies: No Known Drug Allergies  Physical Exam  General:  Well-developed,well-nourished,in no acute distress; alert,appropriate and cooperative throughout examination Head:  Normocephalic and atraumatic without obvious abnormalities. No apparent alopecia but mild  balding. Sinuses NT. Eyes:  Conjunctiva clear bilaterally.  Ears:  External ear exam shows no significant lesions or deformities.  Otoscopic examination reveals clear canals, tympanic membranes are intact bilaterally without bulging, retraction, inflammation but appear thicker than normal/sclerotic.Marland Kitchen Hearing is poor at best bilat... hearing aids in place bilat.. TMs dull to LR. Nose:  External nasal examination shows no deformity or inflammation. Nasal mucosa are pink and moist without lesions or exudates. Mouth:  Oral mucosa and oropharynx without lesions or exudates.  Teeth in good repair. Slight PND. Neck:  No deformities, masses, or tenderness noted. Chest Wall:  No deformities, masses, tenderness or gynecomastia noted. Lungs:  Normal respiratory effort, chest expands symmetrically. Lungs are clear to auscultation, no crackles or wheezes. Heart:  Normal rate and regular rhythm. S1 and S2 normal without gallop, murmur, click, rub or other extra sounds.   Impression & Recommendations:  Problem # 1:  RENAL INSUFFICIENCY (ICD-588.9) Assessment Improved Actually slightly better today.  Problem # 2:  HYPERKALEMIA (ICD-276.7) Assessment: New Avoid bananas and recheck. Hope not to have to stop or decrease Lisinopril. Has given him good tolerable control.  Problem # 3:  HYPERTENSION (ICD-401.9) Assessment: Unchanged Now stable. His updated medication list for this problem includes:    Lisinopril-hydrochlorothiazide 20-12.5 Mg Tabs (Lisinopril-hydrochlorothiazide) ..... One tab by mouth in am.    Amlodipine Besylate 5 Mg Tabs (Amlodipine besylate) .Marland Kitchen... 1 by mouth at bedtime  BP today: 120/70 Prior BP: 124/68 (10/29/2009)  Labs Reviewed: K+: 5.2 (12/28/2009) Creat: : 1.6 (12/28/2009)   Chol:  132 (07/11/2009)   HDL: 39.80 (07/11/2009)   LDL: 76 (07/11/2009)   TG: 83.0 (07/11/2009)  Complete Medication List: 1)  Lisinopril-hydrochlorothiazide 20-12.5 Mg Tabs  (Lisinopril-hydrochlorothiazide) .... One tab by mouth in am. 2)  Amlodipine Besylate 5 Mg Tabs (Amlodipine besylate) .Marland Kitchen.. 1 by mouth at bedtime 3)  Tylenol Ex St Arthritis Pain 500 Mg Tabs (Acetaminophen) .... As needed for pain 4)  Vitamin D 1000 Unit Tabs (Cholecalciferol) .... Take one by mouth daily  Patient Instructions: 1)  Pls schedule BMet in one month for recheck off bananas to insure it has decreased.  Current Allergies (reviewed today): No known allergies

## 2010-08-15 NOTE — Assessment & Plan Note (Signed)
Summary: 2 MONTH FOLLOW UP/[facility]   Vital Signs:  Patient profile:   77 year old male Weight:      184.75 pounds Temp:     97.6 degrees F oral Pulse rate:   60 / minute Pulse rhythm:   irregular BP sitting:   160 / 78  (left arm) Cuff size:   large  Vitals Entered By: Sydell Axon LPN (September 17, 4096 10:09 AM) CC: 2 Month follow-up after labs   History of Present Illness: Ronnie Brown is an 77 y/o caucasian male who presents today for a 2 month follow up on hypertension and an increased dose of lisinopril to 40mg /day.  His blood pressure is still elevated today at 160/78.  The patient states that he otherwise feels well and he has no other medical complaints today.  Problems Prior to Update: 1)  Special Screening Malignant Neoplasm of Prostate  (ICD-V76.44) 2)  Fracture, Forearm  (ICD-813.80) 3)  Neoplasm of Uncertain Behavior of Skin  (ICD-238.2) 4)  Pure Hypercholesterolemia  (ICD-272.0) 5)  Unspec Disorder Carbohydrate Transport&metab  (ICD-271.9) 6)  Renal Insufficiency  (ICD-588.9) 7)  Low Hdl  (ICD-272.5) 8)  Benign Prostatic Hypertrophy- No Further Psa (DR DAHLSTEDT)  (ICD-600.00) 9)  Fracture, Patella  (ICD-822.0) 10)  Hearing Loss, Bilateral  (ICD-389.9) 11)  Hyperglycemia  (ICD-790.29) 12)  Hypertension  (ICD-401.9)  Medications Prior to Update: 1)  Lisinopril 20 Mg Tabs (Lisinopril) .... One Tab By Mouth Two Times A Day 2)  Amlodipine Besylate 5 Mg  Tabs (Amlodipine Besylate) .Marland Kitchen.. 1 By Mouth At Bedtime 3)  Tylenol Ex St Arthritis Pain 500 Mg Tabs (Acetaminophen) .... As Needed For Pain  Allergies: No Known Drug Allergies  Physical Exam  General:  Well-developed,well-nourished,in no acute distress; alert,appropriate and cooperative throughout examination Head:  Normocephalic and atraumatic without obvious abnormalities. No apparent alopecia but mild balding. Sinuses NT. Eyes:  Conjunctiva clear bilaterally.  Nose:  External nasal examination shows no deformity or  inflammation. Nasal mucosa are pink and moist without lesions or exudates. Mouth:  Oral mucosa and oropharynx without lesions or exudates.  Teeth in good repair. Slight PND. Lungs:  Normal respiratory effort, chest expands symmetrically. Lungs are clear to auscultation, no crackles or wheezes. Heart:  Normal rate and regular rhythm. S1 and S2 normal without gallop, murmur, click, rub or other extra sounds.   Impression & Recommendations:  Problem # 1:  HYPERTENSION (ICD-401.9) Assessment Unchanged Will change lisinopril two times a day to Lisinopril/ Hctz once a day. His updated medication list for this problem includes:    Lisinopril-hydrochlorothiazide 20-12.5 Mg Tabs (Lisinopril-hydrochlorothiazide) ..... One tab by mouth in am.    Amlodipine Besylate 5 Mg Tabs (Amlodipine besylate) .Marland Kitchen... 1 by mouth at bedtime  BP today: 160/78 Prior BP: 160/64 (07/18/2009)  Labs Reviewed: K+: 4.8 (09/12/2009) Creat: : 1.4 (09/12/2009)   Chol: 132 (07/11/2009)   HDL: 39.80 (07/11/2009)   LDL: 76 (07/11/2009)   TG: 83.0 (07/11/2009)  Problem # 2:  RENAL INSUFFICIENCY (ICD-588.9) Assessment: Unchanged Stable. Recheck next time.  Complete Medication List: 1)  Lisinopril-hydrochlorothiazide 20-12.5 Mg Tabs (Lisinopril-hydrochlorothiazide) .... One tab by mouth in am. 2)  Amlodipine Besylate 5 Mg Tabs (Amlodipine besylate) .Marland Kitchen.. 1 by mouth at bedtime 3)  Tylenol Ex St Arthritis Pain 500 Mg Tabs (Acetaminophen) .... As needed for pain 4)  Vitamin D 1000 Unit Tabs (Cholecalciferol) .... Take one by mouth daily  Patient Instructions: 1)  RTC 6 weeks. Get Bmet whren here. Prescriptions: LISINOPRIL-HYDROCHLOROTHIAZIDE 20-12.5  MG TABS (LISINOPRIL-HYDROCHLOROTHIAZIDE) one tab by mouth in AM.  #30 x 12   Entered and Authorized by:   Shaune Leeks MD   Signed by:   Shaune Leeks MD on 09/17/2009   Method used:   Electronically to        [facility]* (retail)       [id number] [place], [place]  [address]       Ph: [phone] or [phone]       Fax: [phone]   RxID:   [id number]   Current Allergies (reviewed today): No known allergies

## 2010-08-15 NOTE — Assessment & Plan Note (Signed)
Summary: 6WK F/U WILL GET BMET WHEN HERE / LFW   Vital Signs:  Patient profile:   77 year old male Weight:      185.75 pounds Temp:     97.9 degrees F oral Pulse rate:   60 / minute Pulse rhythm:   irregular BP sitting:   124 / 68  (left arm) Cuff size:   regular  Vitals Entered By: Sydell Axon LPN (October 29, 2009 10:14 AM) CC: 6 Week follow-up, will have labs at this appt.   History of Present Illness: Pt here with daughter for Bp check after again adjusting medication. He is tolerating it well and has no complaints.  Problems Prior to Update: 1)  Special Screening Malignant Neoplasm of Prostate  (ICD-V76.44) 2)  Fracture, Forearm  (ICD-813.80) 3)  Neoplasm of Uncertain Behavior of Skin  (ICD-238.2) 4)  Pure Hypercholesterolemia  (ICD-272.0) 5)  Unspec Disorder Carbohydrate Transport&metab  (ICD-271.9) 6)  Renal Insufficiency  (ICD-588.9) 7)  Low Hdl  (ICD-272.5) 8)  Benign Prostatic Hypertrophy- No Further Psa (DR DAHLSTEDT)  (ICD-600.00) 9)  Fracture, Patella  (ICD-822.0) 10)  Hearing Loss, Bilateral  (ICD-389.9) 11)  Hyperglycemia  (ICD-790.29) 12)  Hypertension  (ICD-401.9)  Medications Prior to Update: 1)  Lisinopril-Hydrochlorothiazide 20-12.5 Mg Tabs (Lisinopril-Hydrochlorothiazide) .... One Tab By Mouth in Am. 2)  Amlodipine Besylate 5 Mg  Tabs (Amlodipine Besylate) .Marland Kitchen.. 1 By Mouth At Bedtime 3)  Tylenol Ex St Arthritis Pain 500 Mg Tabs (Acetaminophen) .... As Needed For Pain 4)  Vitamin D 1000 Unit Tabs (Cholecalciferol) .... Take One By Mouth Daily  Allergies: No Known Drug Allergies  Physical Exam  General:  Well-developed,well-nourished,in no acute distress; alert,appropriate and cooperative throughout examination Head:  Normocephalic and atraumatic without obvious abnormalities. No apparent alopecia but mild balding. Sinuses NT. Eyes:  Conjunctiva clear bilaterally.  Ears:  External ear exam shows no significant lesions or deformities.  Otoscopic  examination reveals clear canals, tympanic membranes are intact bilaterally without bulging, retraction, inflammation but appear thicker than normal/sclerotic.Marland Kitchen Hearing is poor at best bilat... hearing aids in place bilat.. TMs dull to LR. Nose:  External nasal examination shows no deformity or inflammation. Nasal mucosa are pink and moist without lesions or exudates. Mouth:  Oral mucosa and oropharynx without lesions or exudates.  Teeth in good repair. Slight PND. Neck:  No deformities, masses, or tenderness noted. Chest Wall:  No deformities, masses, tenderness or gynecomastia noted. Lungs:  Normal respiratory effort, chest expands symmetrically. Lungs are clear to auscultation, no crackles or wheezes. Heart:  Normal rate and regular rhythm. S1 and S2 normal without gallop, murmur, click, rub or other extra sounds.   Impression & Recommendations:  Problem # 1:  HYPERTENSION (ICD-401.9) Assessment Improved  Finally well controlled. His updated medication list for this problem includes:    Lisinopril-hydrochlorothiazide 20-12.5 Mg Tabs (Lisinopril-hydrochlorothiazide) ..... One tab by mouth in am.    Amlodipine Besylate 5 Mg Tabs (Amlodipine besylate) .Marland Kitchen... 1 by mouth at bedtime  Orders: Venipuncture (04540) TLB-BMP (Basic Metabolic Panel-BMET) (80048-METABOL)  BP today: 124/68 Prior BP: 160/78 (09/17/2009)  Labs Reviewed: K+: 4.8 (09/12/2009) Creat: : 1.4 (09/12/2009)   Chol: 132 (07/11/2009)   HDL: 39.80 (07/11/2009)   LDL: 76 (07/11/2009)   TG: 83.0 (07/11/2009)  Problem # 2:  RENAL INSUFFICIENCY (ICD-588.9) Assessment: Unchanged Stable. Will check today.  Complete Medication List: 1)  Lisinopril-hydrochlorothiazide 20-12.5 Mg Tabs (Lisinopril-hydrochlorothiazide) .... One tab by mouth in am. 2)  Amlodipine Besylate 5 Mg Tabs (Amlodipine besylate) .Marland KitchenMarland KitchenMarland Kitchen  1 by mouth at bedtime 3)  Tylenol Ex St Arthritis Pain 500 Mg Tabs (Acetaminophen) .... As needed for pain 4)  Vitamin D 1000  Unit Tabs (Cholecalciferol) .... Take one by mouth daily  Patient Instructions: 1)  Call for appt in Jan fo Comp Exam. 2)  W=ill discuss labs via phone tree.  Current Allergies (reviewed today): No known allergies

## 2010-08-15 NOTE — Assessment & Plan Note (Signed)
Summary: CPX / LFW   Vital Signs:  Patient profile:   77 year old male Weight:      189.25 pounds Temp:     97.2 degrees F oral Pulse rate:   80 / minute Pulse rhythm:   regular BP sitting:   140 / 72  (left arm) Cuff size:   large  Vitals Entered By: Sydell Axon LPN (Aug 08, 2010 1:57 PM) CC: 30 Minute checkup   History of Present Illness: Pt here for Comp Exam. He feels well with no complaints. He continues to take Vit D daily.  Preventive Screening-Counseling & Management  Alcohol-Tobacco     Alcohol drinks/day: 0     Smoking Status: quit     Year Quit: 1974     Pack years: 45     Passive Smoke Exposure: no  Caffeine-Diet-Exercise     Caffeine use/day: 2     Does Patient Exercise: yes     Type of exercise: walking     Times/week: 7  Problems Prior to Update: 1)  Hyperkalemia  (ICD-276.7) 2)  Special Screening Malignant Neoplasm of Prostate  (ICD-V76.44) 3)  Fracture, Forearm  (ICD-813.80) 4)  Neoplasm of Uncertain Behavior of Skin  (ICD-238.2) 5)  Pure Hypercholesterolemia  (ICD-272.0) 6)  Unspec Disorder Carbohydrate Transport&metab  (ICD-271.9) 7)  Renal Insufficiency  (ICD-588.9) 8)  Low Hdl  (ICD-272.5) 9)  Benign Prostatic Hypertrophy- No Further Psa (DR DAHLSTEDT)  (ICD-600.00) 10)  Fracture, Patella  (ICD-822.0) 11)  Hearing Loss, Bilateral  (ICD-389.9) 12)  Hyperglycemia  (ICD-790.29) 13)  Hypertension  (ICD-401.9)  Medications Prior to Update: 1)  Lisinopril-Hydrochlorothiazide 20-12.5 Mg Tabs (Lisinopril-Hydrochlorothiazide) .... One Tab By Mouth in Am. 2)  Amlodipine Besylate 5 Mg  Tabs (Amlodipine Besylate) .Marland Kitchen.. 1 By Mouth At Bedtime 3)  Tylenol Ex St Arthritis Pain 500 Mg Tabs (Acetaminophen) .... As Needed For Pain 4)  Vitamin D 1000 Unit Tabs (Cholecalciferol) .... Take One By Mouth Daily  Allergies: No Known Drug Allergies  Past History:  Past Medical History: Last updated: 01/22/2007 Hypertension Benign prostatic  hypertrophy  Past Surgical History: Last updated: 01/22/2007 2002         Cystectomy R Upper Arm (Dr Domenica Reamer) 2001/03    Catarract Surgery  R 2001  L Laser 2003 2003         Fx Patella  Rehab 12/28/04     Abd CT - ? pneumonia, ? kidney stones 12/28/04     Pelvic CT, no stones, elev. prostate 6/17 - 01/02/05   [facility], ? pneumonia, urinary retention, gastrocnemia, elev. PSA, ARI 12/29/04     Echo EF 60%, no wall abnormality 01/24/05     TURP (Dahlstedt) 08/19/06       Flexible cystoscopy nonobstrutive  Family History: Last updated: 08/08/2010 Father: Died 64, CA skin in mouth Mother: Died 73, "heart trouble", arrhythmia Siblings: 3 brothers   1 died 07/01/2023 (Clifford) A. Fib, hernia surg., swelling of spinal cord   1 died 12/03/22 Velda Shell) CAD, dementia   1 alive 66  (Winfred) HTN 1 sister died Educational psychologist) health  probs, CAD, DM Son dec Pneumonia after BM Transpl Non Hodgkinn's Lymphoma CV:  (+) Mother "heart trouble", brother with A.Fib. HBP:  (+) self =- in family CA:  Father, skin of mouth Depression:  ? self Stroke:  (-)  Social History: Last updated: 07/17/2008 Current Smoker, 1-1/2 PPD x 30 years - 46 PYH, quit '74 (30 years) Alcohol use-no Drug use-no Marital Status: Married, wife lives  in [place] 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 Fib, hernia surg., swelling of spinal cord   1 died 2022-12-02 Velda Shell) CAD, dementia   1 alive 23  (Winfred) HTN 1 sister died Educational psychologist) health  probs, CAD, DM Son dec Pneumonia after BM Transpl  Non Hodgkinn's Lymphoma CV:  (+) Mother "heart trouble", brother with A.Fib. HBP:  (+) self =- in family CA:  Father, skin of mouth Depression:  ? self Stroke:  (-)  Review of Systems General:  Denies chills, fatigue, fever, sweats, weakness, and weight loss. Eyes:  Denies blurring, discharge, and itching; has eyes examined regularly.. ENT:  Complains of decreased hearing; denies earache and ringing in ears. CV:  Denies chest pain or discomfort, fainting, fatigue, palpitations, shortness of breath with exertion, and swelling of feet. Resp:  Complains of shortness of breath; denies cough and wheezing. GI:  Denies abdominal pain, bloody stools, change in bowel habits, constipation, dark tarry stools, diarrhea, indigestion, loss of appetite, nausea, vomiting, vomiting blood, and yellowish skin color. GU:  Complains of nocturia; denies discharge, dysuria, and urinary frequency; once. MS:  Denies joint pain, low back pain, muscle aches, cramps, and stiffness. Derm:  Denies dryness, itching, and rash. Neuro:  Denies numbness, poor balance, tingling, and tremors.  Physical Exam  General:  Well-developed,well-nourished,in no acute distress; alert,appropriate and cooperative throughout examination, very hard of hearing. Head:  Normocephalic and atraumatic without obvious abnormalities. No apparent alopecia but mild balding. Sinuses NT. Eyes:  Conjunctiva clear bilaterally.  Ears:  External ear exam shows no significant lesions or deformities.  Otoscopic examination reveals clear canals, tympanic membranes are intact bilaterally without bulging, retraction, inflammation but appear thicker than normal/sclerotic.Marland Kitchen Hearing is poor at best bilat... hearing aids in place bilat.. TMs dull to LR. Nose:  External nasal examination shows no deformity or inflammation. Nasal mucosa are pink and moist without lesions or exudates. Mouth:  Oral mucosa and oropharynx without lesions or exudates.  Teeth in good  repair. Slight PND. Neck:  No deformities, masses, or tenderness noted. Chest Wall:  No deformities, masses, tenderness or gynecomastia noted. Breasts:  No masses or gynecomastia noted Lungs:  Normal respiratory effort, chest expands symmetrically. Lungs are clear to auscultation, no crackles or wheezes. Heart:  Normal rate and regular rhythm. S1 and S2 normal without gallop, murmur, click, rub or other extra sounds. Abdomen:  Bowel sounds positive,abdomen soft and non-tender without masses, organomegaly or hernias noted. Mildly protuberant. Rectal:  No external abnormalities noted. Normal sphincter tone. No rectal masses or tenderness. G neg. Genitalia:  Testes bilaterally descended without nodularity, tenderness or masses. No scrotal masses or lesions. No penis lesions or urethral discharge. Prostate:  Prostate gland firm and smooth, no enlargement, nodularity, tenderness, mass, asymmetry or induration. 20gms. Msk:  No deformity or scoliosis noted of thoracic or lumbar spine.   Pulses:  R and L carotid,radial,femoral,dorsalis pedis and posterior tibial pulses are full and equal bilaterally Extremities:  No clubbing, cyanosis, edema, or deformity noted with normal full range of motion of all joints.   Neurologic:  No cranial nerve deficits noted.  Station and gait are normal for age, slightly braod based.  Sensory, motor and coordinative functions appear intact. Skin:  Intact without suspicious lesions or rashes, plentiful AKs and SKs. Cervical Nodes:  No lymphadenopathy noted Inguinal Nodes:  No significant adenopathy Psych:  Cognition and judgment appear intact. Alert and cooperative with normal attention span and concentration. No apparent delusions, illusions, hallucinations   Impression & Recommendations:  Problem # 1:  HEALTH MAINTENANCE EXAM (ICD-V70.0) Assessment Comment Only I have personally reviewed the Medicare Annual Wellness questionnaire and have noted 1.   The patient's  medical and social history 2.   Their use of alcohol, tobacco or illicit drugs 3.   Their current medications and supplements 4.   The patient's functional ability including ADL's, fall risks, home safety risks and hearing or visual             impairment. Very hard of hearing, o/w nml. 5.   Diet and physical activities 6.   Evidence for depression or mood disorders   Problem # 2:  SPECIAL SCREENING MALIGNANT NEOPLASM OF PROSTATE (ICD-V76.44) Assessment: Unchanged Stable exam, too old for PSA..  Problem # 3:  PURE HYPERCHOLESTEROLEMIA (ICD-272.0) Assessment: Unchanged Good control. Labs Reviewed: SGOT: 20 (07/22/2010)   SGPT: 14 (07/22/2010)   HDL:38.30 (07/22/2010), 39.80 (07/11/2009)  LDL:79 (07/22/2010), 76 (07/11/2009)  Chol:127 (07/22/2010), 132 (07/11/2009)  Trig:51.0 (07/22/2010), 83.0 (07/11/2009)  Problem # 4:  UNSPEC DISORDER CARBOHYDRATE TRANSPORT&METAB (ICD-271.9) Assessment: Improved Euglycemic today.  Problem # 5:  RENAL INSUFFICIENCY (ICD-588.9) Assessment: Unchanged Ess stable. Enc fluids. Cont ACEI. Enc good BP control, incr meds today.  Problem # 6:  HYPERTENSION (ICD-401.9) Assessment: Unchanged Still high with renal insuff worse....incr meds. His updated medication list for this problem includes:    Lisinopril 20 Mg Tabs (Lisinopril) .Marland Kitchen... 1 tab in am , 1/2 tab in pm    Amlodipine Besylate 5 Mg Tabs (Amlodipine besylate) .Marland Kitchen... 1 by mouth at bedtime    Hydrochlorothiazide 12.5 Mg Caps (Hydrochlorothiazide) .Marland Kitchen... 1 tab by mouth in am  BP today: 140/72 Prior BP: 142/70 (05/15/2010)  Labs Reviewed: K+: 4.7 (07/22/2010) Creat: : 1.7 (07/22/2010)   Chol: 127 (07/22/2010)   HDL: 38.30 (07/22/2010)   LDL: 79 (07/22/2010)   TG: 51.0 (07/22/2010)  Complete Medication List: 1)  Lisinopril 20 Mg Tabs (Lisinopril) .Marland Kitchen.. 1 tab in am , 1/2 tab in pm 2)  Amlodipine Besylate 5 Mg Tabs (Amlodipine besylate) .Marland Kitchen.. 1 by mouth at bedtime 3)  Tylenol Ex St Arthritis Pain 500  Mg Tabs (Acetaminophen) .... As needed for pain 4)  Vitamin D3 2000 Unit Caps (Cholecalciferol) .... Take one by mouth daily 5)  Hydrochlorothiazide 12.5 Mg Caps (Hydrochlorothiazide) .Marland Kitchen.. 1 tab by mouth in am  Patient Instructions: 1)  RTC 2 mos for BP recheck. 2)  Td and Pneumovax today. 3)  Look into Zostavax. Prescriptions: HYDROCHLOROTHIAZIDE 12.5 MG CAPS (HYDROCHLOROTHIAZIDE) 1 tab by mouth in AM  #30 x 12   Entered and Authorized by:   Shaune Leeks MD   Signed by:   Shaune Leeks MD on 07/24/2010   Method used:   Print then Give to Patient   RxID:   [id number] LISINOPRIL 20 MG TABS (LISINOPRIL) 1 tab in AM , 1/2 tab in PM  #45 x 12   Entered and Authorized by:   Shaune Leeks MD   Signed by:   Shaune Leeks MD on 07/24/2010   Method used:   Print then Give to Patient  RxID:   [id number]    Orders Added: 1)  Est. Patient 65& > [01027]    Current Allergies (reviewed today): No known allergies   Appended Document: CPX / LFW   Tetanus/Td Vaccine    Vaccine Type: Td    Site: left deltoid    Mfr: Sanofi Pasteur    Dose: 0.5 ml    Route: IM    Given by: Sydell Axon LPN    Exp. Date: 08/15/2011    Lot #: [id number]    VIS given: 05/31/08 version given July 24, 2010.  Pneumovax Vaccine    Vaccine Type: Pneumovax (Medicare)    Site: right deltoid    Mfr: Merck    Dose: 0.5 ml    Route: IM    Given by: Sydell Axon LPN    Exp. Date: 11/21/2011    Lot #: [id number]    VIS given: 06/18/09 version given July 24, 2010.

## 2010-09-17 ENCOUNTER — Encounter: Payer: Self-pay | Admitting: Family Medicine

## 2010-09-24 NOTE — Miscellaneous (Signed)
Summary: Zostavax vaccine  Clinical Lists Changes  Observations: Added new observation of ZOSTAVAX: Zostavax (08/26/2010 10:27)      Other Immunization History:    Zostavax # 1:  Zostavax (08/26/2010) Received form from [facility]. Sydell Axon LPN  September 17, 4538 10:28 AM

## 2010-09-25 ENCOUNTER — Encounter: Payer: Self-pay | Admitting: Family Medicine

## 2010-09-25 ENCOUNTER — Ambulatory Visit (INDEPENDENT_AMBULATORY_CARE_PROVIDER_SITE_OTHER): Payer: Medicare Other | Admitting: Family Medicine

## 2010-09-25 DIAGNOSIS — I10 Essential (primary) hypertension: Secondary | ICD-10-CM

## 2010-10-01 NOTE — Assessment & Plan Note (Signed)
Summary: 2 MONTH FU/BP CHECK/RBH   Vital Signs:  Patient profile:   76 year old male Weight:      187.50 pounds Temp:     97.9 degrees F oral Pulse rate:   72 / minute Pulse rhythm:   regular BP sitting:   158 / 80  (left arm) Cuff size:   large  Vitals Entered By: Sydell Axon LPN (September 25, 2010 2:27 PM) CC: 2 Month follow-up on BP   Problems Prior to Update: 1)  Health Maintenance Exam  (ICD-V70.0) 2)  Special Screening Malignant Neoplasm of Prostate  (ICD-V76.44) 3)  Pure Hypercholesterolemia  (ICD-272.0) 4)  Unspec Disorder Carbohydrate Transport&metab  (ICD-271.9) 5)  Renal Insufficiency  (ICD-588.9) 6)  Low Hdl  (ICD-272.5) 7)  Benign Prostatic Hypertrophy- No Further Psa (DR DAHLSTEDT)  (ICD-600.00) 8)  Hearing Loss, Bilateral  (ICD-389.9) 9)  Hypertension  (ICD-401.9)  Medications Prior to Update: 1)  Lisinopril 20 Mg Tabs (Lisinopril) .Marland Kitchen.. 1 Tab in Am , 1/2 Tab in Pm 2)  Amlodipine Besylate 5 Mg  Tabs (Amlodipine Besylate) .Marland Kitchen.. 1 By Mouth At Bedtime 3)  Tylenol Ex St Arthritis Pain 500 Mg Tabs (Acetaminophen) .... As Needed For Pain 4)  Vitamin D3 2000 Unit Caps (Cholecalciferol) .... Take One By Mouth Daily 5)  Hydrochlorothiazide 12.5 Mg Caps (Hydrochlorothiazide) .Marland Kitchen.. 1 Tab By Mouth in Am 6)  Zostavax 91478 Unt/0.71ml Solr (Zoster Vaccine Live) .... Administer  Allergies: No Known Drug Allergies  Physical Exam  General:  Well-developed,well-nourished,in no acute distress; alert,appropriate and cooperative throughout examination, very hard of hearing. Head:  Normocephalic and atraumatic without obvious abnormalities. No apparent alopecia but mild balding. Sinuses NT. Eyes:  Conjunctiva clear bilaterally.  Ears:  External ear exam shows no significant lesions or deformities.  Otoscopic examination reveals clear canals, tympanic membranes are intact bilaterally without bulging, retraction, inflammation but appear thicker than normal/sclerotic.Marland Kitchen Hearing is poor at  best bilat... hearing aids in place bilat.. TMs dull to LR. Nose:  External nasal examination shows no deformity or inflammation. Nasal mucosa are pink and moist without lesions or exudates. Mouth:  Oral mucosa and oropharynx without lesions or exudates.  Teeth in good repair. Slight PND. Neck:  No deformities, masses, or tenderness noted. Lungs:  Normal respiratory effort, chest expands symmetrically. Lungs are clear to auscultation, no crackles or wheezes. Heart:  Normal rate and regular rhythm. S1 and S2 normal without gallop, murmur, click, rub or other extra sounds.   Impression & Recommendations:  Problem # 1:  HYPERTENSION (ICD-401.9) Assessment Unchanged Was supposed to coont Amlodipine. Add back and restart. Script sent. The following medications were removed from the medication list:    Amlodipine Besylate 5 Mg Tabs (Amlodipine besylate) .Marland Kitchen... 1 by mouth at bedtime His updated medication list for this problem includes:    Lisinopril 20 Mg Tabs (Lisinopril) .Marland Kitchen... 1 tab in am , 1/2 tab in pm    Hydrochlorothiazide 12.5 Mg Caps (Hydrochlorothiazide) .Marland Kitchen... 1 tab by mouth in am    Amlodipine Besylate 5 Mg Tabs (Amlodipine besylate) ..... One tab by mouth aty bedtime.  BP today: 158/80 Prior BP: 140/72 (07/24/2010)  Labs Reviewed: K+: 4.7 (07/22/2010) Creat: : 1.7 (07/22/2010)   Chol: 127 (07/22/2010)   HDL: 38.30 (07/22/2010)   LDL: 79 (07/22/2010)   TG: 51.0 (07/22/2010)  Complete Medication List: 1)  Lisinopril 20 Mg Tabs (Lisinopril) .Marland Kitchen.. 1 tab in am , 1/2 tab in pm 2)  Tylenol Ex St Arthritis Pain 500 Mg Tabs (Acetaminophen) .Marland KitchenMarland KitchenMarland Kitchen  As needed for pain 3)  Vitamin D3 2000 Unit Caps (Cholecalciferol) .... Take one by mouth daily 4)  Hydrochlorothiazide 12.5 Mg Caps (Hydrochlorothiazide) .Marland Kitchen.. 1 tab by mouth in am 5)  Zostavax 16109 Unt/0.42ml Solr (Zoster vaccine live) .... Administer 6)  Amlodipine Besylate 5 Mg Tabs (Amlodipine besylate) .... One tab by mouth aty  bedtime.  Patient Instructions: 1)  RTC 3 mos, recheck BP. Prescriptions: AMLODIPINE BESYLATE 5 MG TABS (AMLODIPINE BESYLATE) one tab by mouth aty bedtime.  #30 12 x 12   Entered and Authorized by:   Shaune Leeks MD   Signed by:   Shaune Leeks MD on 09/25/2010   Method used:   Electronically to        [facility]* (retail)       [address] [place], [place]  [address]       Ph: [phone] or [phone]       Fax: [phone]   RxID:   [id number]    Orders Added: 1)  Est. Patient Level III [66440]    Current Allergies (reviewed today): No known allergies

## 2010-11-26 NOTE — Assessment & Plan Note (Signed)
[facility]                                 ON-CALL NOTE   NAME:COBLEKit, Mollett                       MRN:          [id number]  DATE:12/04/2007                            DOB:          09-Jun-1923    CALLER:  Anthoney Harada, the daughter.   OBJECTIVE:  The patient is supposed to be seen in the clinic this  morning.  He will be late.  They did not count on the time it would take  to get here, told to come in to be seen.  We will see him when they get  here.   Primary care Fabrice Dyal is Dr. [name], home office is [facility].     [name], MD  Electronically Signed    RNS/MedQ  DD: 12/04/2007  DT: 12/04/2007  Job #: [id number]

## 2010-11-29 NOTE — H&P (Signed)
NAMEABDIAS, HICKAM NO.:  [id number]   MEDICAL RECORD NO.:  [id number]          PATIENT TYPE:  EMS   LOCATION:  MAJO                         FACILITY:  [facility]   PHYSICIAN:  Wanda Plump, MD [facility]    DATE OF BIRTH:  May 27, 1923   DATE OF ADMISSION:  12/28/2004  DATE OF DISCHARGE:                                HISTORY & PHYSICAL   PRIMARY CARE DOCTOR:  Dr. [name] at [facility].   CHIEF COMPLAINT:  Nausea, vomiting.   HISTORY OF PRESENT ILLNESS:  Mr. Gauthreaux is a 77 year old white male who was  brought to the emergency room by his daughter because he has been sick for  the last five to six days.  He reports nausea which is mostly post prandial.  He has also vomited several times over the last week without any  hematemesis.  He p.o. has been quite decreased.  He saw his PCP initially  four days ago with above symptoms, and he was prescribed Cipro and Phenergan  but that has not helped.  They have also noticed cough on and off.  The  cough today is actually better than earlier this week.  In addition to that,  the patient has noticed difficulty urinating on and off for the last two  weeks.  Sometimes he is able to urinate a few cc, sometimes he feels half  empty only.  He has not noticed any dysuria or gross hematuria.   PAST MEDICAL HISTORY:  1.  Hypertension.  2.  Decreased hearing.  3.  Borderline diabetes, diagnosed a year ago.  4.  They think he had an appendectomy years ago but no other surgeries.  5.  No history of heart disease or benign prostatic hypertrophy that the      patient or family knows of.   FAMILY HISTORY:  Noncontributory.   SOCIAL HISTORY:  He smoked tobacco but quit 20 years ago and does not drink.   MEDICATIONS:  Atenolol, dose unknown, Lisinopril, dose unknown, HCTZ, dose  unknown, Cipro and promethazine for the last four days.   ALLERGIES:  No known drug allergies.   PHYSICAL EXAMINATION:  GENERAL:  The patient is alert and  oriented in no  apparent distress, cooperative and coherent.  VITAL SIGNS:  He is afebrile.  Pulse 57, blood pressure 147/72, respirations  20.  O2 sat 93% on room air.  HEENT:  Oropharynx is dry without any redness or discharge.  LUNGS:  He has a few wheezes at both bases.  ABDOMEN:  Slightly distended but nontender and he has good bowel sounds.  EXTREMITIES:  There is no peripheral edema.  RECTAL:  Digital exam shows obviously enlarged prostate which is not tender.  Stools are brown and hemoccult negative.  NEUROLOGIC:  Speech is normal.  He has a reasonable recollection of all the  symptoms and motor is nonfocal.   LABORATORY AND X-RAYS:  White count is 15.7, hemoglobin 13.1, platelets 288.  Potassium 4.0, creatinine 3.9, BUN 67, blood sugar 132.  LFTs are normal.  Lipase is normal.  X-ray of the abdomen  and chest show a right base opacity  consistent with most likely pneumonia and a questionable mild ileus.  A CT  of the abdomen and pelvis is pending.  Urinalysis shows trace of blood with  7-10 RBCs and a few bacteria.   ASSESSMENT/PLAN:  1.  From the gastrointestinal standpoint, he is admitted with nausea and      diarrhea.  We will go ahead and hydrate him.  Check a stool culture and      Clostridium difficile.  Start Protonix.  We will also provide      symptomatic treatment.  The differential diagnosis for the nausea is      large but may be due to pneumonia, acute gastroenteritis, peptic ulcer      disease, gallbladder disease, etcetera.  We will follow up him      clinically and order further workup if needed.  2.  Pulmonary.  The patient does have pneumonia based on the x-ray findings      and the fact that he is coughing.  He will be started on Rocephin and      Zithromax.  3.  He does have prostate symptoms.  We will check a PSA.  4.  Hypertension.  This is well controlled at present; we will, however,      hold the ACE inhibitors and HCTZ because of renal failure.  5.   Renal failure.  Hopefully, this will be prerenal.  We will hydrate him      and recheck a BMP tomorrow.       JEP/MEDQ  D:  12/28/2004  T:  12/28/2004  Job:  [id number]

## 2010-11-29 NOTE — H&P (Signed)
NAMEMIKLO, AKEN NO.:  [id number]   MEDICAL RECORD NO.:  [id number]          PATIENT TYPE:  EMS   LOCATION:  MAJO                         FACILITY:  [facility]   PHYSICIAN:  Wanda Plump, MD [facility]    DATE OF BIRTH:  1923-04-27   DATE OF ADMISSION:  12/28/2004  DATE OF DISCHARGE:                                HISTORY & PHYSICAL   PRIMARY CARE PHYSICIAN:  [name], M.D. at [facility].   Dictation ended at this point.       JEP/MEDQ  D:  12/28/2004  T:  12/28/2004  Job:  [id number]

## 2010-11-29 NOTE — Op Note (Signed)
[name], [name]                ACCOUNT NO.:  [id number]   MEDICAL RECORD NO.:  [id number]          PATIENT TYPE:  INP   LOCATION:  1419                         FACILITY:  [facility]   PHYSICIAN:  Bertram Millard. Dahlstedt, M.D.DATE OF BIRTH:  04/01/1923   DATE OF PROCEDURE:  01/27/2005  DATE OF DISCHARGE:                                 OPERATIVE REPORT   PREOPERATIVE DIAGNOSES:  1.  Benign prostatic hypertrophy.   POSTOPERATIVE DIAGNOSES:  1.  Benign prostatic hypertrophy.  2.  Acute urinary retention.   PROCEDURE:  1.  Cystourethroscopy.  2.  Transurethral resection of the prostate.   SURGEON:  Bertram Millard. Retta Diones, M.D.   ASSISTANT:  Glade Nurse.   ANESTHESIA:  Spinal.   SPECIMENS:  Prostatic chips.   PROCEDURE:  Patient was identified by his wrist bracelet and brought to room  #10, where he received preoperative antibiotics and was administered spinal  anesthesia.  He was then prepped and draped in the usual sterile fashion.  Next, using [employer] sounds, the patient's anterior and posterior urethra  was easily dilated from 24 to 32 [place].  Next, a 28 French rigid  resectoscope sheath was placed to the level of the bladder, clearing off  what was obtained.  Next, the resectoscope was docked with the sheath.  The  patient underwent pan-cystourethroscopy, which demonstrated a mildly  trabeculated bladder with ureteral orifices in their normal anatomic  position, effluxing clear urine.  There was mild hypertrophy of the median  bar.  There were no foreign bodies or mucosal lesions.  Next, using glycine  as an irrigant with normal TURP settings, we resected the patient's  prostatic urethra.  This was done by placing the distal end of the scope at  the verumontanum and not migrating it back distally.  We first created a  channel at the 12 o'clock position and then resected any sequential fashion  from the 6 o'clock to the 11 o'clock position from the bladder neck to the  level of the verumontanum.  Once this resection was done to an adequate  depth, we then resected from the 6 o'clock to the 1 o'clock position, again,  moving sequentially from 6 o'clock to 1 o'clock between the bladder neck and  the verumontanum.  Again, once this deemed to be an adequate depth,  excellent hemostasis was obtained with the coag setting.  Next, we examined  the anterior portion at 12 o'clock and noted that after the resection, there  was a mild-to-moderate amount of tissue that had been displaced into the  lumen.  This was resected and again, excellent hemostasis was obtained.  Next, the resectoscope was removed, and the bladder was irrigated with a  To[place]y syringe.  All prostatic chips were evacuated and passed off the field  as a specimen.  We next re-inspected the bladder.  The bilateral urethral  orifices were noted to not be involved in resection and effluxing clear  urine bilaterally.  The resection fossa was noted to be hemostatic without  any active  bleeding.  The resectoscope was removed, and a 22 [place] three-way  catheter  was placed and connected to normal saline continuous bladder irrigation.  The patient was then transported to the PACU in stable condition.   Dr. Marcine Matar was present for the entire case and participated in  all aspects of the case.       SAM/MEDQ  D:  01/27/2005  T:  01/27/2005  Job:  [id number]

## 2010-11-29 NOTE — Discharge Summary (Signed)
Ronnie [name], Ronnie [name]                ACCOUNT NO.:  [id number]   MEDICAL RECORD NO.:  [id number]          PATIENT TYPE:  INP   LOCATION:  5708                         FACILITY:  [facility]   PHYSICIAN:  Rene Paci, M.D. LHCDATE OF BIRTH:  [birth year]   DATE OF ADMISSION:  12/28/2004  DATE OF DISCHARGE:  01/02/2005                                 DISCHARGE SUMMARY   DISCHARGE DIAGNOSES:  1.  Infectious gastroenteritis.  2.  Pneumonia.  3.  Asymptomatic bradycardia.  4.  Elevated PSA.  Will need outpatient urology consult.  5.  Acute renal insufficiency.   HISTORY OF PRESENT ILLNESS:  Patient is a 77 year old male who was brought  to the emergency room by his daughter with report of post prandial nausea x5  to 6 days.  Patient was given Cipro and Phenergan as an outpatient prior to  admission but had not had any relief.  In addition, patient was coughing  occasionally at the time of admission.  Patient complained as well that he  was having difficulty urinating and inability to empty his bladder prior to  admission.  A Foley was placed and patient was admitted for further  evaluation.   PAST MEDICAL HISTORY:  1.  Hypertension.  2.  Decreased hearing.  3.  Borderline diabetes.  4.  History of appendectomy in the past.   HOSPITAL COURSE:  Patient was admitted for further evaluation.   PROBLEM #1 -  ACUTE GASTROENTERITIS:  Patient underwent stool cultures which  were negative.  In addition, patient underwent a CT of the abdomen and  pelvis which revealed an enlarged prostate.  Patient was given IV hydration.  Gastroenteritis resolved.  Patient was then able to tolerate p.o.'s.   PROBLEM #2 -  PNEUMONIA:  Patient was placed on Rocephin and Azithromycin IV  for a total of four days while in the hospital and was later switched to  p.o. Avelox.  Chest x-ray revealed a right base opacity, questionable  pneumonia.  Plan to discharge patient on p.o. Avelox for a total of 14 days.   PROBLEM #3 -  ELEVATED PSA:  As a result of voiding issues, a PSA was sent  at time of admission and was noted to be 7.37.  Patient will need outpatient  urology consult for elevated PSA.   PROBLEM #4 -  BRADYCARDIA:  Patient was placed on atenolol 50 mg.  Patient  was noted to be bradycardic.  Atenolol was dropped to 25 mg daily and the  patient remained bradycardic.  As a result, beta-blocker was discontinued  all together.  Patient was started on low doses of Lisinopril and  hydrochlorothiazide which patient had been taking at home, however, he was  not aware of doses.  Low doses were started of each.  Patient is currently  on Lisinopril 10 mg daily and hydrochlorothiazide 12.5 mg daily.  At the  time of discharge, patient's blood pressure is 107/52.   Plan to discontinue Foley and discharge patient to home with p.o.  antibiotics and close follow-up as long as patient is able to void.   DISCHARGE  MEDICATIONS:  1.  Lisinopril 10 mg p.o. daily.  2.  Hydrochlorothiazide 12.5 mg p.o. daily.  3.  Avelox 400 mg p.o. daily for nine additional days, last dose January 11, 2005.   Prescriptions were given for a 30-day supply of Lisinopril and  hydrochlorothiazide and a nine-day supple of Avelox.   DISCHARGE LABORATORY DATA:  PSA 7.37.  BUN 12, creatinine 1.3.  Hemoglobin  11.8, hematocrit 34.4.   A 2-D echo was performed during this hospitalization which revealed an  ejection fraction of 60% with no wall motion abnormalities.   FOLLOW UP:  Patient is to follow up with Dr. [name] on Monday, January 06, 2005, at 9 o'clock a.m.  Patient is to call Dr. [name] or go to the ER  should he develop fever or shortness of breath.       MSO/MEDQ  D:  01/02/2005  T:  01/02/2005  Job:  [id number]   cc:   [name], M.D.  [address] [place]  [place] [address]  Fax: [phone]

## 2010-11-29 NOTE — H&P (Signed)
NAMEJULEZ, HUSEBY                ACCOUNT NO.:  [id number]   MEDICAL RECORD NO.:  [id number]           PATIENT TYPE:   LOCATION:                               FACILITY:  [facility]   PHYSICIAN:  Bertram Millard. Dahlstedt, M.D.DATE OF BIRTH:  08/28/1922   DATE OF ADMISSION:  DATE OF DISCHARGE:                                HISTORY & PHYSICAL   CHIEF COMPLAINT:  Urinary retention.   HISTORY OF PRESENT ILLNESS:  This is a very pleasant 77 year old gentleman  sent on consultation by Dr. Peter Congo for evaluation and management of  urinary retention.  He was admitted to the hospital on December 28, 2004 for  food poisoning.  He was in retention at that time.  Foley catheter was  removed prior to discharge.  He passed initial voiding trial, but once  discharged home, he had to return here for Foley replacement due to acute  urinary retention. He was seen in consultation, and was placed on a trial of  Flomax as well finishing his antibiotic course.  The patient, unfortunately,  failed voiding trial x2.  In speaking with him, prior to these episodes, the  patient denies any past medical history of urinary retention.  He does have  decreased force in stream and feeling of incomplete emptying as well  urgency.  He denies any previous history of hematuria, kidney stones or  urinary tract infections.   PAST MEDICAL HISTORY:  1.  Hypertension.  2.  Diabetes.   CURRENT MEDICATIONS:  1.  Hydrochlorothiazide.  2.  Dilaudid.  3.  Lisinopril.   ALLERGIES:  NKDA.   PAST SURGICAL HISTORY:  1.  Appendectomy.  2.  Laser eye surgery.   SOCIAL HISTORY:  He is married.  He has 4 children.  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  He quit cigarette smoking 20 years ago  after a 35-pack-year history.  He denies any alcohol or drug use.   FAMILY HISTORY:  Significant for non-Hodgkin's lymphoma, breast cancer,  heart disease and hypertension.   REVIEW OF SYSTEMS:  Positive for discomfort from his  catheter.  All other  systems are negative unless outlined above.   PHYSICAL EXAMINATION:  VITAL SIGNS:  Temperature 97.0, pulse 80,  respirations 16, blood pressure 155/70.  NECK:  Supple.  No thyromegaly or adenopathy.  CHEST:  Regular rate and rhythm with no murmurs, rubs or gallops.  Lungs  clear to auscultation.  ABDOMEN:  Soft, nontender to palpation, without costovertebral angle  tenderness.  Positive bowel sounds.  There is no hepatosplenomegaly or  masses.  GENITOURINARY:  Foley catheter is in place.  Phallus normal without lesion.  There are no fibrotic areas or plaques.  Meatus is in normal location.  The  patient's foreskin retracts easily.  His scrotal skin is without lesion.  Testicles are descended bilaterally and are slightly atrophic, and are  otherwise of normal tone.  RECTAL:  Normal tone, 1+ BPH without nodularity or fluctuance.  EXTREMITIES:  Without edema, cyanosis or clubbing.  NEUROLOGIC:  Alert and oriented x3.  Cranial nerves are intact.  There are  no focal deficits.   IMPRESSION:  Benign prostatic hypertrophy with acute urinary retention  status post failure of trial of void x3.   PLAN:  We have reviewed the patient's options at length with both him and  his family, and they have elected to pursue surgical management consisting  of TURP.  They understand the benefits and the risks of this procedure.       MT/MEDQ  D:  01/27/2005  T:  01/27/2005  Job:  [id number]   cc:   Bertram Millard. Dahlstedt, M.D.  [address]  [place]  [place] [address]  Fax: [phone]

## 2010-11-30 ENCOUNTER — Encounter: Payer: Self-pay | Admitting: Family Medicine

## 2010-12-12 ENCOUNTER — Encounter: Payer: Self-pay | Admitting: Family Medicine

## 2010-12-12 ENCOUNTER — Ambulatory Visit (INDEPENDENT_AMBULATORY_CARE_PROVIDER_SITE_OTHER): Payer: Medicare Other | Admitting: Family Medicine

## 2010-12-12 DIAGNOSIS — I10 Essential (primary) hypertension: Secondary | ICD-10-CM

## 2010-12-12 NOTE — Progress Notes (Signed)
  Subjective:    Patient ID: Ronnie Brown, male    DOB: 01/13/1923, 77 y.o.   MRN: [id number]  HPIPt here with his daughter for recheck of his BP. He had mistakenly stopped his Amlodipine last time when we had started a new medication and had elevation of his pressure. He has been back on it for 2 mos. He admits to missing some haphazardly. His daughter has suggested getting a pill box which he is not enthused about but I have reinforced using one rather than increasing medication. He feels well and has no complaints. His hearing continues to be a problem, but he does reasonably well for having such a hard time hearing.    Review of SystemsNoncontributory except as above.       Objective:   Physical ExamWDWN WM NAD HEENT wnl Heart RRR Lungs CTA        Assessment & Plan:

## 2010-12-12 NOTE — Patient Instructions (Signed)
RTC 1/13 for Comp Exam, labs prior.

## 2010-12-12 NOTE — Assessment & Plan Note (Signed)
Improved but still minimally high. Do your best not to miss any meds. Will cont to follow,

## 2011-03-15 DIAGNOSIS — N200 Calculus of kidney: Secondary | ICD-10-CM

## 2011-03-15 DIAGNOSIS — R911 Solitary pulmonary nodule: Secondary | ICD-10-CM | POA: Insufficient documentation

## 2011-03-15 HISTORY — DX: Calculus of kidney: N20.0

## 2011-03-15 HISTORY — DX: Solitary pulmonary nodule: R91.1

## 2011-03-15 HISTORY — PX: OTHER SURGICAL HISTORY: SHX169

## 2011-03-28 ENCOUNTER — Inpatient Hospital Stay (HOSPITAL_COMMUNITY)
Admission: EM | Admit: 2011-03-28 | Discharge: 2011-03-29 | DRG: 694 | Disposition: A | Discharge status: Home / Self Care | Payer: Medicare Other | Attending: Internal Medicine | Admitting: Internal Medicine

## 2011-03-28 ENCOUNTER — Emergency Department (HOSPITAL_COMMUNITY): Payer: Medicare Other

## 2011-03-28 ENCOUNTER — Telehealth: Payer: Self-pay | Admitting: *Deleted

## 2011-03-28 DIAGNOSIS — N183 Chronic kidney disease, stage 3 unspecified: Secondary | ICD-10-CM | POA: Diagnosis present

## 2011-03-28 DIAGNOSIS — H9190 Unspecified hearing loss, unspecified ear: Secondary | ICD-10-CM | POA: Diagnosis present

## 2011-03-28 DIAGNOSIS — I10 Essential (primary) hypertension: Secondary | ICD-10-CM | POA: Diagnosis present

## 2011-03-28 DIAGNOSIS — Z9079 Acquired absence of other genital organ(s): Secondary | ICD-10-CM

## 2011-03-28 DIAGNOSIS — N201 Calculus of ureter: Principal | ICD-10-CM | POA: Diagnosis present

## 2011-03-28 DIAGNOSIS — N200 Calculus of kidney: Secondary | ICD-10-CM | POA: Diagnosis present

## 2011-03-28 DIAGNOSIS — Z87448 Personal history of other diseases of urinary system: Secondary | ICD-10-CM

## 2011-03-28 DIAGNOSIS — I129 Hypertensive chronic kidney disease with stage 1 through stage 4 chronic kidney disease, or unspecified chronic kidney disease: Secondary | ICD-10-CM | POA: Diagnosis present

## 2011-03-28 DIAGNOSIS — E119 Type 2 diabetes mellitus without complications: Secondary | ICD-10-CM | POA: Diagnosis present

## 2011-03-28 DIAGNOSIS — E860 Dehydration: Secondary | ICD-10-CM | POA: Diagnosis present

## 2011-03-28 DIAGNOSIS — N179 Acute kidney failure, unspecified: Secondary | ICD-10-CM | POA: Diagnosis present

## 2011-03-28 DIAGNOSIS — F29 Unspecified psychosis not due to a substance or known physiological condition: Secondary | ICD-10-CM | POA: Diagnosis present

## 2011-03-28 DIAGNOSIS — N1330 Unspecified hydronephrosis: Secondary | ICD-10-CM | POA: Diagnosis present

## 2011-03-28 DIAGNOSIS — Z87891 Personal history of nicotine dependence: Secondary | ICD-10-CM

## 2011-03-28 DIAGNOSIS — IMO0002 Reserved for concepts with insufficient information to code with codable children: Secondary | ICD-10-CM | POA: Diagnosis present

## 2011-03-28 DIAGNOSIS — J984 Other disorders of lung: Secondary | ICD-10-CM | POA: Diagnosis present

## 2011-03-28 DIAGNOSIS — R911 Solitary pulmonary nodule: Secondary | ICD-10-CM | POA: Diagnosis present

## 2011-03-28 LAB — DIFFERENTIAL
Basophils Absolute: 0 10*3/uL (ref 0.0–0.1)
Eosinophils Relative: 0 % (ref 0–5)
Lymphs Abs: 0.8 10*3/uL (ref 0.7–4.0)
Monocytes Absolute: 0.7 10*3/uL (ref 0.1–1.0)
Monocytes Relative: 5 % (ref 3–12)

## 2011-03-28 LAB — URINALYSIS, ROUTINE W REFLEX MICROSCOPIC
Ketones, ur: 15 mg/dL — AB
Leukocytes, UA: NEGATIVE
Nitrite: NEGATIVE
Protein, ur: NEGATIVE mg/dL
Urobilinogen, UA: 1 mg/dL (ref 0.0–1.0)

## 2011-03-28 LAB — CBC
MCH: 31.9 pg (ref 26.0–34.0)
MCHC: 34.8 g/dL (ref 30.0–36.0)
Platelets: 220 10*3/uL (ref 150–400)
RDW: 12.9 % (ref 11.5–15.5)

## 2011-03-28 LAB — COMPREHENSIVE METABOLIC PANEL
AST: 17 U/L (ref 0–37)
Albumin: 3.9 g/dL (ref 3.5–5.2)
CO2: 26 mEq/L (ref 19–32)
Calcium: 10.2 mg/dL (ref 8.4–10.5)
Creatinine, Ser: 1.75 mg/dL — ABNORMAL HIGH (ref 0.50–1.35)
GFR calc non Af Amer: 37 mL/min — ABNORMAL LOW (ref >60)
Sodium: 137 mEq/L (ref 135–145)
Total Protein: 7.1 g/dL (ref 6.0–8.3)

## 2011-03-28 LAB — GLUCOSE, CAPILLARY

## 2011-03-28 MED ORDER — IOHEXOL 300 MG/ML  SOLN
80.0000 mL | Freq: Once | INTRAMUSCULAR | Status: AC | PRN
  Administered 2011-03-28: 80 mL via INTRAVENOUS

## 2011-03-28 NOTE — Telephone Encounter (Signed)
Pt's daughter called at aroung 4 this afternoon stating that pt has had vomiting since this morning, feels very weak.  She wanted pt to be seen here.  Advised her that if pt needs fluids we cant give them here and pt would be better off going to ER.  I checked with Dr. Sharen Hones and he too said pt should go to ER.  Advised daughter.

## 2011-03-29 ENCOUNTER — Encounter: Payer: Self-pay | Admitting: Family Medicine

## 2011-03-29 ENCOUNTER — Inpatient Hospital Stay (HOSPITAL_COMMUNITY): Payer: Medicare Other

## 2011-03-29 ENCOUNTER — Inpatient Hospital Stay (HOSPITAL_COMMUNITY)
Admission: AD | Admit: 2011-03-29 | Discharge: 2011-04-01 | Disposition: A | Discharge status: Home / Self Care | Payer: Medicare Other | Attending: Internal Medicine | Admitting: Internal Medicine

## 2011-03-29 LAB — COMPREHENSIVE METABOLIC PANEL
ALT: 10 U/L (ref 0–53)
Albumin: 3.3 g/dL — ABNORMAL LOW (ref 3.5–5.2)
Alkaline Phosphatase: 59 U/L (ref 39–117)
Chloride: 101 mEq/L (ref 96–112)
Glucose, Bld: 124 mg/dL — ABNORMAL HIGH (ref 70–99)
Potassium: 4.3 mEq/L (ref 3.5–5.1)
Sodium: 137 mEq/L (ref 135–145)
Total Protein: 6.2 g/dL (ref 6.0–8.3)

## 2011-03-29 LAB — PHOSPHORUS: Phosphorus: 3.1 mg/dL (ref 2.3–4.6)

## 2011-03-29 LAB — BASIC METABOLIC PANEL
CO2: 28 mEq/L (ref 19–32)
Calcium: 8.9 mg/dL (ref 8.4–10.5)
Creatinine, Ser: 2.06 mg/dL — ABNORMAL HIGH (ref 0.50–1.35)
Glucose, Bld: 135 mg/dL — ABNORMAL HIGH (ref 70–99)

## 2011-03-29 LAB — CBC
Hemoglobin: 13.1 g/dL (ref 13.0–17.0)
MCHC: 33.6 g/dL (ref 30.0–36.0)
WBC: 11.9 10*3/uL — ABNORMAL HIGH (ref 4.0–10.5)

## 2011-03-29 LAB — MAGNESIUM: Magnesium: 1.9 mg/dL (ref 1.5–2.5)

## 2011-03-29 NOTE — Consult Note (Signed)
NAMELONN, IM NO.:  [id number]  MEDICAL RECORD NO.:  [id number]  LOCATION:  5506                         FACILITY:  [facility]  PHYSICIAN:  Natalia Leatherwood, MD    DATE OF BIRTH:  1922/09/14  DATE OF CONSULTATION:  03/29/2011 DATE OF DISCHARGE:                                CONSULTATION   REQUESTING SERVICE:  Dr. Brien Few.  CHIEF COMPLAINT:  Right-sided urolithiasis.  HISTORY OF PRESENT ILLNESS:  This is a patient who presented to the emergency department with onset of right-sided lower abdominal pain. The pain had been present for 48 hours.  The pain radiated to his ipsilateral right back.  It was associated with nausea and emesis.  It was not associated with gross hematuria or dysuria.  The pain was sharp and colicky in nature.  He has not experienced a pain like this before. In the emergency department, the patient did have a CT scan, which revealed a 4.6 mm right-sided proximal ureteral stone.  There is a small non-obstructing left lower pole stone. I reviewed the images and report. The patient had no signs or symptoms of infection at that time and he was admitted to the hospital for IV hydration and control of his nausea and pain.  His creatinine has slightly increased from a level of 1.75 to 2.01.  The patient reports that today his nausea is greatly decreased as is his pain.  We discussed management options.  I explained to the patient that he has approximately 80% chance of passing a stone of this size on his own.  I explained that the reason for why would intervene would be signs or symptoms of infection, isolated or solitary kidney or intractable pain or nausea.  We discussed other treatment options, which would include a right-sided ureteral stent placement with the cystoscopy versus ureteroscopy to try to remove the stone.  I explained to the patient that occasionally it is difficult to remove the stone with ureteroscopy initially.  We discussed  the risks and benefits of the procedures, which include but are not limited to heart attack, stroke, death, bleeding, infection, damage to contiguous structures, ureteral avulsion, ureteral injury, ureteral stricture, failure to retrieve the stone or place a stent, need for nephrostomy tube.  The patient voiced understanding of these risks for the procedure.  PAST MEDICAL HISTORY: 1. Diabetes. 2. Poor hearing acuity. 3. Hypertension.  PAST SURGICAL HISTORY: 1. Transurethral resection of prostate in 2006, Dr. Retta Diones. 2. History of appendectomy. 3. Laser eye surgery.  ALLERGIES:  No known drug allergies.  MEDICATIONS: 1. Amlodipine. 2. Hydrochlorothiazide. 3. Vitamin B12. 4. Lisinopril.  SOCIAL HISTORY:  The patient states he no longer smokes, but he is a former smoker.  He denies use of alcohol or recreational drugs.  FAMILY HISTORY:  Negative for prostate cancer to his knowledge. Negative for kidney stones to his knowledge.  REVIEW OF SYSTEMS:  Positive for nausea, emesis, abdominal discomfort. Negative for changes in vision, new skin rash, shortness of breath. Further 10-point review of systems is negative.  PHYSICAL EXAM:  VITAL SIGNS:  Temperature is 98.3 that is the T current, T max is 98.3.  Blood pressure is 138/68,  pulse is 67, respirations 18, saturation 95% on 2 liters. GENERAL:  No acute distress.  Awake, alert. ENT:  Normocephalic.  Atraumatic. NECK:  Supple.  No lymphadenopathy. CHEST:  Equal effort bilaterally.  No crackles bilaterally. CARDIOVASCULAR:  S1, S2 present.  Regular rate. ABDOMEN:  Mildly tender to palpation in the right lower quadrant.  No rebound tenderness. BACK:  No CVA tenderness on the right.  No CVA tenderness on the left. EXTREMITIES:  No gross deformities.  Able to move all extremities appropriately. SKIN:  No apparent rash.  Good turgor. NEUROLOGICAL:  He has decreased hearing acuity.  He is oriented to person, place, and  time. GENITOURINARY:  Foley catheter in place, draining clear yellow urine.  ASSESSMENT:  This is a patient with a right-sided ureteral stone.  PLAN:  At this point, the patient would like to try to pass the stone. I am unsure that this is going to be possible as he has a bump in his creatinine.  We discussed the surgical interventions as mentioned in the history of present illness.  I have offered to give the patient an additional day to try to see if his creatinine will stabilize.  I have ordered for a creatinine to be drawn today at noon and again tomorrow morning.  I will make him n.p.o. at midnight.  If he has had no improvement, then I will recommend surgical intervention.  I recommend he be [facility] to [employer] should he need surgical intervention.  Therefore, we can have the appropriate equipment available for any surgical intervention.  Informed consent was obtained today should he need surgery tomorrow.  I discussed this plan with Dr. Brien Few.          ______________________________ Natalia Leatherwood, MD     DW/MEDQ  D:  03/29/2011  T:  03/29/2011  Job:  [id number]  Electronically Signed by Natalia Leatherwood MD on 03/29/2011 04:51:24 PM

## 2011-03-30 ENCOUNTER — Inpatient Hospital Stay (HOSPITAL_COMMUNITY): Payer: Medicare Other

## 2011-03-30 LAB — CBC
Hemoglobin: 14.2 g/dL (ref 13.0–17.0)
RBC: 4.56 MIL/uL (ref 4.22–5.81)
WBC: 17 10*3/uL — ABNORMAL HIGH (ref 4.0–10.5)

## 2011-03-30 LAB — BASIC METABOLIC PANEL
CO2: 27 mEq/L (ref 19–32)
GFR calc non Af Amer: 23 mL/min — ABNORMAL LOW (ref >60)
Glucose, Bld: 139 mg/dL — ABNORMAL HIGH (ref 70–99)
Potassium: 4 mEq/L (ref 3.5–5.1)
Sodium: 135 mEq/L (ref 135–145)

## 2011-03-30 LAB — URINE CULTURE: Culture: NO GROWTH

## 2011-03-30 NOTE — Op Note (Signed)
Ronnie Brown, Ronnie Brown NO.:  [id number]  MEDICAL RECORD NO.:  [id number]  LOCATION:  1444                         FACILITY:  [facility]  PHYSICIAN:  Natalia Leatherwood, MD    DATE OF BIRTH:  July 11, 1923  DATE OF PROCEDURE:  03/30/2011 DATE OF DISCHARGE:                              OPERATIVE REPORT   SURGEON:  Natalia Leatherwood, MD  ASSISTANT:  None.  PREOPERATIVE DIAGNOSIS:  Right obstructing ureteral stone.  POSTOPERATIVE DIAGNOSIS:  Right obstructing ureteral stone.  PROCEDURE PERFORMED:  Cystoscopy, right ureteroscopy, and right ureteral stent placement.  FINDINGS:  Inability to place a flexible ureteroscope beyond the mid ureter on the right side.  ESTIMATED BLOOD LOSS:  Less than 10 mL.  SPECIMEN:  None.  COMPLICATIONS:  None.  HISTORY OF PRESENT ILLNESS:  This is an 77 year old gentleman who presented with right flank pain who had a right proximal obstructing stone.  He is admitted to the medicine service, given IV hydration, and pain control.  The patient was able to control his pain and decrease his nausea, however, his creatinine continued to increase.  Because of this, I recommended surgical intervention.  PROCEDURE IN DETAIL:  After informed consent was obtained, the patient was taken to the operating room where he was placed in the supine position.  IV antibiotics were infused and general anesthesia was induced.  He was then placed in the dorsal lithotomy position, making sure to pad all pertinent neurovascular pressure points.  His genitals were then prepped and draped in usual sterile fashion.  Next, a cystoscope was then advanced through the urethra into the bladder.  The right ureteral orifice was identified and cannulated with 2 sensor tip wires.  These were placed up into the renal pelvis on the right side with good curl on fluoroscopy.  Next, a 12-14 ureteral access sheath was placed by first placing the obturator up into the ureter and  held in for several minutes, then removing and placing the entire sheath up into the ureter.  This was able to be placed only into the proximal ureter about to the level of the pelvic brim.  After this was done, the safety wire was secured to the drape and the obturator and working wire were removed.  A semi-rigid ureteroscope was unable to be passed beyond the length of the ureteral access sheath and therefore the digital flexible scope was placed up the ureteral access sheath.  I was able to navigate to approximately the mid ureter before I reached an area where I could not pass the scope any further.  I did not feel it was appropriate to force the scope further and therefore I removed the scope.  The ureteral access sheath was removed with the scope visualizing the entire distal ureter.  There were no lesions noted throughout the ureter.  Cystoscope was then placed back through the urethra over the safety wire and a 6 x 26 double-J ureteral stent was placed up over the wire with a good curl in the right renal pelvis on fluoroscopy and a good curl in the bladder.  The bladder was emptied and then the anesthesia was reversed as the patient was placed  back into supine position.  Taken to the recovery area in stable condition.          ______________________________ Natalia Leatherwood, MD     DW/MEDQ  D:  03/30/2011  T:  03/30/2011  Job:  [id number]  Electronically Signed by Natalia Leatherwood MD on 03/30/2011 10:00:07 PM

## 2011-03-31 LAB — CBC
Hemoglobin: 11.6 g/dL — ABNORMAL LOW (ref 13.0–17.0)
MCH: 31.7 pg (ref 26.0–34.0)
MCV: 94.3 fL (ref 78.0–100.0)
RBC: 3.66 MIL/uL — ABNORMAL LOW (ref 4.22–5.81)

## 2011-03-31 LAB — BASIC METABOLIC PANEL
CO2: 25 mEq/L (ref 19–32)
Calcium: 8.7 mg/dL (ref 8.4–10.5)
Glucose, Bld: 92 mg/dL (ref 70–99)
Sodium: 136 mEq/L (ref 135–145)

## 2011-04-01 LAB — CBC
HCT: 40.9 % (ref 39.0–52.0)
MCH: 30.9 pg (ref 26.0–34.0)
MCV: 94.2 fL (ref 78.0–100.0)
Platelets: 188 10*3/uL (ref 150–400)
RBC: 4.34 MIL/uL (ref 4.22–5.81)

## 2011-04-01 LAB — BASIC METABOLIC PANEL
BUN: 37 mg/dL — ABNORMAL HIGH (ref 6–23)
CO2: 26 mEq/L (ref 19–32)
Calcium: 9.4 mg/dL (ref 8.4–10.5)
Creatinine, Ser: 1.68 mg/dL — ABNORMAL HIGH (ref 0.50–1.35)
Glucose, Bld: 113 mg/dL — ABNORMAL HIGH (ref 70–99)

## 2011-04-07 ENCOUNTER — Encounter: Payer: Self-pay | Admitting: Family Medicine

## 2011-04-20 ENCOUNTER — Encounter: Payer: Self-pay | Admitting: Family Medicine

## 2011-04-24 NOTE — Discharge Summary (Signed)
NAMESKYLEN, Brown NO.:  [id number]  MEDICAL RECORD NO.:  [id number]  LOCATION:  1444                         FACILITY:  [facility]  PHYSICIAN:  Zannie Cove, MD     DATE OF BIRTH:  September 16, 1922  DATE OF ADMISSION:  03/29/2011 DATE OF DISCHARGE:  04/01/2011                              DISCHARGE SUMMARY   PRIMARY CARE PHYSICIAN:  [name], MD  UROLOGIST:  Bertram Millard. Dahlstedt, MD  DISCHARGE DIAGNOSES: 1. Right obstructing ureteral stone, status post cystoscopy,     ureteroscopy, and right ureteral stent placement. 2. Acute renal failure, on chronic kidney disease stage III, improved. 3. History of benign prostatic hypertrophy, status post transurethral     resection of the prostate in July 2006. 4. History of 7 mm right lung base nodule needs outpatient followup. 5. History of decreased hearing. 6. Diet-controlled diabetes. 7. Hypertension.  DISCHARGE MEDICATIONS: 1. Tylenol 650 mg q.4 h. p.r.n. 2. Tamsulosin 0.4 mg p.o. daily. 3. Amlodipine 5 mg 2 tablets p.o. daily. 4. Hydrochlorothiazide 12.5 mg 1 capsule daily. 5. Vitamin D3 1000 units 2 tablets p.o. daily.  CONSULTANTS:  Dr. Retta Diones and Dr. Natalia Leatherwood , with [facility]  PROCEDURES:  Cystoscopy, ureteroscopy, and right ureteral stent placement by Dr. Margarita Grizzle on March 30, 2011.  DIAGNOSTIC IMAGING STUDIES:  CT of the abdomen and pelvis on September 14, mild-to-moderate right-sided hydronephrosis, marked perinephric stranding in fluid about the right kidney with an obstructing 5 x 5 mm stone in the proximal right ureter, 6 cm below the right pelvis, decreased right renal opacity raises concern for mild pyelo, multiple bilateral renal cysts noted, nonobstructing 3-mm stone in the lower pole of the right kidney, diffuse calcification of the abdominal aorta, 7 mm nodule in the right lung base.  HOSPITAL COURSE:  Mr. Ronnie Brown is an 77 year old gentleman with prior history of  BPH, TURP, hypertension, diet-controlled diabetes, presented to the hospital with nausea, vomiting, abdominal pain.  On imaging, he was found to have a right ureteral obstructing calculus with acute renal failure. 1. For his obstructing right ureteral calculus, he was seen by Dr.     Margarita Grizzle with Urology in consultation, who did cystoscopy,     ureteroscopy, and ureteral stent placement.  The was able to     tolerate the procedure well.  His urine cultures have been     negative, hence antibiotics being discontinued.  In addition, his     creatinine continues to improve with 1.6 at the time of discharge     and is better than his baseline of 1.7.  The patient is being     discharged home to follow up with Dr.  Retta Diones in a couple of     weeks' time. 2. Acute renal failure, on chronic kidney disease improved.  His     baseline creatinine is 1.7 and his creatinine peak was 2.6 in the     hospital stay which was multifactorial less likely from contrast     nephropathy, combined with hydronephrosis.  This has improved with     fluid resuscitation as well as holding his ACE inhibitor.  The  patient is being discharged home without the ACE inhibitor and his     creatinine at the time of discharge as stated above is 1.6. 3. Diet-controlled diabetes, stable.  DISCHARGE CONDITION:  Stable.  VITAL SIGNS:  Temperature is 97.7, pulse 75, blood pressure 150/82, respirations 18, and satting 94% room air.  Discharge follow up with Dr. [name] in 1 week and Dr. Retta Diones in 2 weeks' time.     Zannie Cove, MD     PJ/MEDQ  D:  04/01/2011  T:  04/01/2011  Job:  [id number]  cc:   Bertram Millard. Dahlstedt, M.D. [name], MD  Electronically Signed by Zannie Cove  on 04/24/2011 02:23:54 PM

## 2011-04-26 NOTE — H&P (Signed)
NAMEMONTAGUE, CORELLA                ACCOUNT NO.:  [id number]  MEDICAL RECORD NO.:  [id number]  LOCATION:  [facility]                         FACILITY:  [facility]  PHYSICIAN:  Lonia Blood, M.D.      DATE OF BIRTH:  01-23-1923  DATE OF ADMISSION:  03/28/2011 DATE OF DISCHARGE:                             HISTORY & PHYSICAL   PRIMARY CARE PHYSICIAN:  [name], MD  PRESENTING COMPLAINT:  Nausea, vomiting, and abdominal pain.  HISTORY OF PRESENT ILLNESS:  The patient is an 77 year old gentleman with history of hypertension and BPH status post TURP many years ago. He has been doing fine and lives alone since his wife died 2 years ago. This morning, he got up and was trying to feed himself breakfast.  Right after breakfast, he started having severe abdominal pain.  It is mainly right-sided associated with some nausea and vomiting.  This persisted and the patient was brought in for further management.  He is currently feeling better after pain medicine.  He denied any sick contacts. Denied any fever or chills but his pain is persistent.  He was seen in the ED, a urologist was consulted, Dr. Margarita Grizzle.  He saw him outside. The patient will be admitted, he will not be able to see him until tomorrow.  PAST MEDICAL HISTORY: 1. Hypertension. 2. BPH status post TURP in January 27, 2005, by Dr. Retta Diones. 3. History of decreased hearing. 4. Diet-controlled diabetes. 5. Status post appendectomy. 6. Status post laser eye surgery.  ALLERGIES:  No known drug allergies.  CURRENT MEDICATIONS:  Lisinopril, hydrochlorothiazide, amlodipine, and vitamin B12.  SOCIAL HISTORY:  The patient is widowed, lives alone.  His wife died about 2 years ago.  [place]  He is a former smoker whose last tobacco smoke was about 30 years ago.  No alcohol.  No IV drug use.  FAMILY HISTORY:  Not significant due to the patient's age.  REVIEW OF SYSTEMS:  All systems reviewed are negative  except per HPI.  PHYSICAL EXAMINATION:  VITAL SIGNS:  Temperature 96.3 initially, currently 99.1 with blood pressure 111/67, pulse 64, respiratory rate 14, sats 95% on room air. GENERAL:  He is awake, alert, oriented but very decrease in hearing. His son is here with him. HEENT:  PERRL.  EOMI.  Decreased hearing bilaterally.  He has hearing aids in both ears but still not able to hear adequately.  No rhinorrhea. NECK:  Supple.  No JVD, no lymphadenopathy. RESPIRATORY:  The patient has decreased air entry at the bases with some mild basilar crackles but no wheezes nor rales. CARDIOVASCULAR SYSTEM:  He has S1-S2.  No audible murmur. ABDOMEN:  Soft, full, nontender with positive bowel sounds. EXTREMITIES:  No edema, cyanosis, clubbing. SKIN:  No rashes or ulcers. MUSCULOSKELETAL:  No significant joint swelling or tenderness.  LABORATORY DATA:  Lipase of 42, sodium is 137, potassium 4.6, chloride 101, CO2 of 26, glucose 127, BUN 32, creatinine 1.75 with a calcium of 10.2, albumin 3.9.  The rest of the LFTs are within normal.  His GFR is 37.  His urinalysis showed some ketones, otherwise, no blood.  No leukocytes seen.  White count is 13.9 with a left shift, ANC of 12.3, hemoglobin 14.6, platelet 820.  Portable abdominal x-ray showed negative exam.  CT abdomen and pelvis showed mild-to-moderate right-sided hydronephrosis with marked perinephric stranding and fluid above the right kidney.  There is an obstructive 5 x 5 mm in the proximal right ureter, it is about 6 cm below the right renal pelvis.  There is also decreased right renal opacity which raises a question for mild pyelonephritis.  Also multiple bilateral renal cysts were noted, nonobstructive 3 mm were also noted at the lower pole of the right kidney.  There was diffuse calcification along the abdominal aorta and its branches.  Also, 7-mm nodule is noted at the right lung base.  ASSESSMENT:  This is an 77 year old gentleman  presenting with right- sided kidney stone, dehydration with acute renal insufficiency, lung nodule, hypertension and severely decreased hearing.  PLAN: 1. Right kidney stone.  As indicated, Urology has been consulted, Dr.     Margarita Grizzle and we expect him to see the patient in the morning.  In     the meantime, we will admit the patient for pain control, nausea     control, we will hydrate the patient pending Urology evaluation.     After he is seen by Urology, a decision will be made whether to     conduct the procedure here at [facility] or [place]. 2. History of BPH.  He has been stable with no problem with his urine     output at this point. 3. Acute renal failure more than likely combination of prerenal but     also post renal from his mild hydronephrosis and blockage.  I will     cautiously hydrate the patient and once he is seen by Urology and     if he spontaneously passes the stone, we expect his renal function     to improve. 4. Lung nodule.  This will need to be followed if he has not been done     before.  I cannot see anything from previous x-rays but again these     dates back to 2006. 5. Hypertension.  We will resume all his home medication as soon as     possible.     Lonia Blood, M.D.     Verlin Grills  D:  03/29/2011  T:  03/29/2011  Job:  [id number]  Electronically Signed by Lonia Blood M.D. on 04/26/2011 03:08:40 PM

## 2011-05-06 ENCOUNTER — Encounter: Payer: Self-pay | Admitting: Family Medicine

## 2011-07-01 ENCOUNTER — Encounter: Payer: Self-pay | Admitting: Family Medicine

## 2011-07-20 ENCOUNTER — Encounter: Payer: Self-pay | Admitting: Family Medicine

## 2011-07-22 ENCOUNTER — Encounter: Payer: Self-pay | Admitting: Family Medicine

## 2011-07-30 ENCOUNTER — Other Ambulatory Visit: Payer: Self-pay | Admitting: Family Medicine

## 2011-07-30 DIAGNOSIS — N259 Disorder resulting from impaired renal tubular function, unspecified: Secondary | ICD-10-CM

## 2011-07-30 DIAGNOSIS — I10 Essential (primary) hypertension: Secondary | ICD-10-CM

## 2011-08-04 ENCOUNTER — Other Ambulatory Visit (INDEPENDENT_AMBULATORY_CARE_PROVIDER_SITE_OTHER): Payer: Medicare Other

## 2011-08-04 DIAGNOSIS — N259 Disorder resulting from impaired renal tubular function, unspecified: Secondary | ICD-10-CM

## 2011-08-04 DIAGNOSIS — I10 Essential (primary) hypertension: Secondary | ICD-10-CM

## 2011-08-04 LAB — LIPID PANEL
Cholesterol: 131 mg/dL (ref 0–200)
Total CHOL/HDL Ratio: 3
Triglycerides: 60 mg/dL (ref 0.0–149.0)

## 2011-08-04 LAB — CBC WITH DIFFERENTIAL/PLATELET
Eosinophils Absolute: 0.2 10*3/uL (ref 0.0–0.7)
HCT: 43.2 % (ref 39.0–52.0)
Lymphs Abs: 1.6 10*3/uL (ref 0.7–4.0)
MCHC: 34 g/dL (ref 30.0–36.0)
MCV: 94.5 fl (ref 78.0–100.0)
Monocytes Absolute: 0.6 10*3/uL (ref 0.1–1.0)
Neutrophils Relative %: 68.4 % (ref 43.0–77.0)
Platelets: 215 10*3/uL (ref 150.0–400.0)

## 2011-08-04 LAB — RENAL FUNCTION PANEL
Albumin: 4 g/dL (ref 3.5–5.2)
BUN: 44 mg/dL — ABNORMAL HIGH (ref 6–23)
Calcium: 9.3 mg/dL (ref 8.4–10.5)
Creatinine, Ser: 2 mg/dL — ABNORMAL HIGH (ref 0.4–1.5)
Glucose, Bld: 103 mg/dL — ABNORMAL HIGH (ref 70–99)

## 2011-08-06 ENCOUNTER — Ambulatory Visit: Payer: Medicare Other | Admitting: Family Medicine

## 2011-08-06 ENCOUNTER — Ambulatory Visit (INDEPENDENT_AMBULATORY_CARE_PROVIDER_SITE_OTHER): Payer: Medicare Other | Admitting: Family Medicine

## 2011-08-06 ENCOUNTER — Encounter: Payer: Self-pay | Admitting: Family Medicine

## 2011-08-06 ENCOUNTER — Other Ambulatory Visit: Payer: Self-pay | Admitting: *Deleted

## 2011-08-06 DIAGNOSIS — I10 Essential (primary) hypertension: Secondary | ICD-10-CM

## 2011-08-06 DIAGNOSIS — N200 Calculus of kidney: Secondary | ICD-10-CM

## 2011-08-06 DIAGNOSIS — N259 Disorder resulting from impaired renal tubular function, unspecified: Secondary | ICD-10-CM

## 2011-08-06 DIAGNOSIS — N400 Enlarged prostate without lower urinary tract symptoms: Secondary | ICD-10-CM

## 2011-08-06 DIAGNOSIS — R911 Solitary pulmonary nodule: Secondary | ICD-10-CM

## 2011-08-06 DIAGNOSIS — H9190 Unspecified hearing loss, unspecified ear: Secondary | ICD-10-CM

## 2011-08-06 MED ORDER — AMLODIPINE BESYLATE 10 MG PO TABS: 10.0000 mg | ORAL_TABLET | Freq: Every day | ORAL | Status: DC

## 2011-08-06 MED ORDER — HYDROCHLOROTHIAZIDE 12.5 MG PO TABS: 12.5000 mg | ORAL_TABLET | ORAL | Status: DC

## 2011-08-06 NOTE — Patient Instructions (Signed)
Goal blood pressure is <130/80. Increase amlodipine to 2 pills until you run out, then new prescription will be for 10mg  of amlodipine nightly (only 1 tablet). Good to see you today, call us with questions. Return in 3 months for follow up, prior fasting for blood work again.

## 2011-08-06 NOTE — Progress Notes (Signed)
  Subjective:    Patient ID: Ronnie Brown, male    DOB: [birth year], 78 y.o.   MRN: [id number]  HPI CC: f/u HTN  pleasant 78 yo with h/o HTN, hearing impairment, BPH, solitary pulm nodule found 03/2011.    Recent R kidney stone, leading to some obstruction, stent placed and removed, likely passed afterwards.  saw Dr. Retta Diones for this.  rec continue flomax indefinitely (h/o BPH).  First time had kidney stone.  Last CPE 07/2010, due for rpt.  Here today for HTN checkup.  Reports compliance with bp meds.  Started using pill box.  Doing well with this.  Denies recent fevers/chills, HA, vision changes, chest pain, tightness, SOB, leg swelling.  No lows, dizziness.  Doesn't check bp at home. BP Readings from Last 3 Encounters:  08/06/11 144/90  12/12/10 144/70  09/25/10 158/80   Lab Results  Component Value Date   CREATININE 2.0* 08/04/2011  [facility] - knows to avoid dehydration, NSAIDs.  Past Medical History  Diagnosis Date  . Hypertension   . BPH (benign prostatic hyperplasia)     s/p TURP 2006  . History of CT scan of abdomen 12/28/04    ? pneumonia, ? kidney stones  . History of CT pelvic 12/28/04    no stones, elev. prostate  . Solitary lung nodule 03/2011    7mm R lung base  . Kidney stone 03/2011    obstructive R upper ureteral stone s/p stent placement/ureteral dilatation, stent removal, likely passed.  remaining 3 mm R lower pole stone asxs  . Hearing impairment     bilateral severe sensori-neural, hearing aids, last audiology eval 06/2011  ([facility])   No falls in the last year.    Review of Systems Per HPI    Objective:   Physical Exam  Vitals reviewed. Constitutional: He appears well-developed and well-nourished. No distress.  HENT:  Head: Normocephalic and atraumatic.  Mouth/Throat: Oropharynx is clear and moist. No oropharyngeal exudate.  Eyes: Conjunctivae and EOM are normal. Pupils are equal, round, and reactive to light. No scleral icterus.  Neck: Normal  range of motion. Neck supple. Carotid bruit is not present.  Cardiovascular: Normal rate, regular rhythm, normal heart sounds and intact distal pulses.   No murmur heard. Pulmonary/Chest: Breath sounds normal. No respiratory distress. He has no wheezes. He has no rales.  Abdominal: Soft. There is no tenderness.       No abd/renal bruits  Skin: Skin is warm and dry. No rash noted.       Assessment & Plan:

## 2011-08-06 NOTE — Assessment & Plan Note (Addendum)
Chronic, not at goal.  Goal <130/80 given CRI stage 3. Increase amlodipine to 10mg  nightly. rtc 3 mo for f/u. May benefit from loop diuretic instead of thiazide given CRI.

## 2011-08-06 NOTE — Assessment & Plan Note (Signed)
H/o pulm nodule found on CT abd 03/2011. If low risk, rec f/u 6-55mo. Likely discuss rpt CT when return at CPE.

## 2011-08-06 NOTE — Assessment & Plan Note (Signed)
Resolved

## 2011-08-06 NOTE — Assessment & Plan Note (Signed)
Discussed avoidance of NSAIDs (takes tylenol only) and improtance of staying well hydrated.

## 2011-08-07 ENCOUNTER — Other Ambulatory Visit: Payer: Self-pay

## 2011-08-07 MED ORDER — HYDROCHLOROTHIAZIDE 12.5 MG PO TABS: 12.5000 mg | ORAL_TABLET | ORAL | Status: DC

## 2011-08-07 MED ORDER — TAMSULOSIN HCL 0.4 MG PO CAPS: 0.4000 mg | ORAL_CAPSULE | Freq: Every day | ORAL | Status: DC

## 2011-08-07 MED ORDER — LISINOPRIL 20 MG PO TABS: ORAL_TABLET | ORAL | Status: DC

## 2011-08-07 NOTE — Telephone Encounter (Signed)
Pt came by office and had requested refills on Lisinopril 20 mg,tamsulosin 0.4 mg and HCTZ 12.5mg  from [facility] and was told that he should contact Dr Sibyl Parr office re; refills.Pt is almost out of meds and would like called in today. Pt needs call back after refill med to [phone].

## 2011-08-07 NOTE — Telephone Encounter (Signed)
Meds refilled. Message left notifying patient.

## 2011-10-28 ENCOUNTER — Other Ambulatory Visit (INDEPENDENT_AMBULATORY_CARE_PROVIDER_SITE_OTHER): Payer: Medicare Other

## 2011-10-28 DIAGNOSIS — N259 Disorder resulting from impaired renal tubular function, unspecified: Secondary | ICD-10-CM

## 2011-10-28 DIAGNOSIS — I10 Essential (primary) hypertension: Secondary | ICD-10-CM

## 2011-10-28 LAB — COMPREHENSIVE METABOLIC PANEL
CO2: 26 mEq/L (ref 19–32)
Creatinine, Ser: 1.6 mg/dL — ABNORMAL HIGH (ref 0.4–1.5)
GFR: 45.01 mL/min — ABNORMAL LOW (ref >60.00)
Glucose, Bld: 96 mg/dL (ref 70–99)
Total Bilirubin: 0.6 mg/dL (ref 0.3–1.2)
Total Protein: 6.9 g/dL (ref 6.0–8.3)

## 2011-10-28 LAB — TSH: TSH: 2.72 u[IU]/mL (ref 0.35–5.50)

## 2011-10-28 LAB — MICROALBUMIN / CREATININE URINE RATIO
Microalb Creat Ratio: 2.2 mg/g (ref 0.0–30.0)
Microalb, Ur: 2.3 mg/dL — ABNORMAL HIGH (ref 0.0–1.9)

## 2011-10-28 NOTE — Progress Notes (Signed)
Addended by: Baldomero Lamy on: 10/28/2011 08:28 AM   Modules accepted: Orders

## 2011-10-29 LAB — VITAMIN D 25 HYDROXY (VIT D DEFICIENCY, FRACTURES): Vit D, 25-Hydroxy: 52 ng/mL (ref 30–89)

## 2011-11-04 ENCOUNTER — Encounter: Payer: Medicare Other | Admitting: Family Medicine

## 2011-11-04 ENCOUNTER — Encounter: Payer: Self-pay | Admitting: Family Medicine

## 2011-11-04 ENCOUNTER — Ambulatory Visit (INDEPENDENT_AMBULATORY_CARE_PROVIDER_SITE_OTHER): Payer: Medicare Other | Admitting: Family Medicine

## 2011-11-04 VITALS — BP 150/80 | HR 64 | Temp 97.4°F | Ht 70.0 in | Wt 187.8 lb

## 2011-11-04 DIAGNOSIS — N400 Enlarged prostate without lower urinary tract symptoms: Secondary | ICD-10-CM

## 2011-11-04 DIAGNOSIS — Z0000 Encounter for general adult medical examination without abnormal findings: Secondary | ICD-10-CM

## 2011-11-04 DIAGNOSIS — N200 Calculus of kidney: Secondary | ICD-10-CM

## 2011-11-04 DIAGNOSIS — R911 Solitary pulmonary nodule: Secondary | ICD-10-CM

## 2011-11-04 DIAGNOSIS — Z1211 Encounter for screening for malignant neoplasm of colon: Secondary | ICD-10-CM

## 2011-11-04 DIAGNOSIS — H9190 Unspecified hearing loss, unspecified ear: Secondary | ICD-10-CM

## 2011-11-04 DIAGNOSIS — I10 Essential (primary) hypertension: Secondary | ICD-10-CM

## 2011-11-04 DIAGNOSIS — N259 Disorder resulting from impaired renal tubular function, unspecified: Secondary | ICD-10-CM

## 2011-11-04 MED ORDER — LISINOPRIL 20 MG PO TABS: ORAL_TABLET | ORAL | Status: DC

## 2011-11-04 MED ORDER — TAMSULOSIN HCL 0.4 MG PO CAPS: 0.4000 mg | ORAL_CAPSULE | Freq: Every day | ORAL | Status: DC

## 2011-11-04 NOTE — Progress Notes (Signed)
Subjective:    Patient ID: Ronnie Brown, male    DOB: [birth year], 78 y.o.   MRN: [id number]  HPI CC: medicare wellness visit  pleasant 78 yo with h/o HTN, hearing impairment, BPH, solitary pulm nodule found 03/2011 presents for medicare wellness visit.   No concerns today  Not taking flomax - reminded to take daily per uro recs.  pulm nodule 7mm -rpt ct to schedule today given remote h/o smoking.  Wt Readings from Last 3 Encounters:  11/04/11 187 lb 12 oz (85.163 kg)  08/06/11 187 lb 8 oz (85.049 kg)  12/12/10 183 lb 12 oz (83.348 kg)  weight very stable.  Preventative: Vision check yearly in summer Hearing impaired - checks yearly in December Flu shot yearly utd on tetanus, pna, shingles shots (2012) No falls in last year.  No depression. Prostate - not doing PSAs given age Colon - no h/o colonoscopy.  No recent stool cards. Advanced directives: discussed - pt will discuss with family.  Medications and allergies reviewed and updated in chart.  Past histories reviewed and updated if relevant as below. Patient Active Problem List  Diagnoses  . HYPERTENSION  . RENAL INSUFFICIENCY  . Solitary lung nodule  . Hearing impairment  . Kidney stone  . BPH (benign prostatic hyperplasia)   Past Medical History  Diagnosis Date  . Hypertension   . BPH (benign prostatic hyperplasia)     s/p TURP 2006  . History of CT scan of abdomen 12/28/04    ? pneumonia, ? kidney stones  . History of CT pelvic 12/28/04    no stones, elev. prostate  . Solitary lung nodule 03/2011    7mm R lung base  . Kidney stone 03/2011    obstructive R upper ureteral stone s/p stent placement/ureteral dilatation, stent removal, likely passed.  remaining 3 mm R lower pole stone asxs  . Hearing impairment     bilateral severe sensori-neural, hearing aids, last audiology eval 06/2011  ([facility])   Past Surgical History  Procedure Date  . Cystectomy 2002    right upper arm (Dr. Domenica Reamer)  . Cataract  surgery 2001/03    Right 2001, Left laser 2003  . Patella fracture surgery 2003    Rehab  . Hospitalization 06/17-06/22/06    [facility], ? pneumonia, urinary retention, gastrocnemia, elevated PSA, ARI  . History of echo 12/29/04    EF 60%, no wall abnormality  . Transurethral resection of prostate 01/24/05    Dahlstedt  . Cystoscopy 08/19/06    flexible cystoscopy nonobstrutive  . Hospitalization 03/2011    R obstructing ureteral stone s/p cystoscopy, ureteroscopy, R ureteral stent placement, ARF, CKD stage 3, found pulm nodule   History  Substance Use Topics  . Smoking status: Former Smoker -- 1.5 packs/day for 30 years  . Smokeless tobacco: Never Used   Comment: Quit 1974  . Alcohol Use: No   Family History  Problem Relation Age of Onset  . Heart disease Mother     heart trouble, arrhythmia  . Diabetes Mother   . Cancer Father     skin in mouth  . Heart disease Sister     CAD  . Diabetes Sister   . Heart disease Brother     A. Fib  . Heart disease Daughter     CAD  . Dementia Daughter   . Hypertension Brother   . Cancer Son     lymphoma  . Stroke Neg Hx    No Known Allergies Current  Outpatient Prescriptions on File Prior to Visit  Medication Sig Dispense Refill  . acetaminophen (TYLENOL) 500 MG tablet Take 500 mg by mouth as needed.        Marland Kitchen amLODipine (NORVASC) 10 MG tablet Take 1 tablet (10 mg total) by mouth at bedtime.  90 tablet  3  . Cholecalciferol (VITAMIN D3) 2000 UNITS capsule Take 2,000 Units by mouth daily.        . hydrochlorothiazide (HYDRODIURIL) 12.5 MG tablet Take 1 tablet (12.5 mg total) by mouth every morning.  30 tablet  6  . DISCONTD: lisinopril (PRINIVIL,ZESTRIL) 20 MG tablet Take one by mouth every am and 1/2 every pm  45 tablet  3     Review of Systems  Constitutional: Negative for fever, chills, activity change, appetite change, fatigue and unexpected weight change.  HENT: Negative for hearing loss and neck pain.   Eyes: Negative for visual  disturbance.  Respiratory: Negative for cough, chest tightness, shortness of breath and wheezing.   Cardiovascular: Negative for chest pain, palpitations and leg swelling.  Gastrointestinal: Negative for nausea, vomiting, abdominal pain, diarrhea, constipation, blood in stool and abdominal distention.  Genitourinary: Negative for hematuria and difficulty urinating.  Musculoskeletal: Negative for myalgias and arthralgias.  Skin: Negative for rash.  Neurological: Negative for dizziness, seizures, syncope and headaches.  Hematological: Does not bruise/bleed easily.  Psychiatric/Behavioral: Negative for dysphoric mood. The patient is not nervous/anxious.        Objective:   Physical Exam  Nursing note and vitals reviewed. Constitutional: He is oriented to person, place, and time. He appears well-developed and well-nourished. No distress.  HENT:  Head: Normocephalic and atraumatic.  Right Ear: External ear normal.  Left Ear: External ear normal.  Nose: Nose normal.  Mouth/Throat: Oropharynx is clear and moist. No oropharyngeal exudate.  Eyes: Conjunctivae and EOM are normal. Pupils are equal, round, and reactive to light. No scleral icterus.  Neck: Normal range of motion. Neck supple. Carotid bruit is not present.  Cardiovascular: Normal rate, regular rhythm, normal heart sounds and intact distal pulses.   No murmur heard. Pulses:      Radial pulses are 2+ on the right side, and 2+ on the left side.  Pulmonary/Chest: Effort normal and breath sounds normal. No respiratory distress. He has no wheezes. He has no rales.  Abdominal: Soft. Bowel sounds are normal. He exhibits no distension and no mass. There is no tenderness. There is no rebound and no guarding.  Genitourinary: Rectum normal. Rectal exam shows no external hemorrhoid, no internal hemorrhoid, no fissure, no mass, no tenderness and anal tone normal. Guaiac negative stool. Prostate is enlarged (30-40gm). Prostate is not tender.    Musculoskeletal: Normal range of motion. He exhibits no edema.  Lymphadenopathy:    He has no cervical adenopathy.  Neurological: He is alert and oriented to person, place, and time.       CN grossly intact, station and gait intact  Skin: Skin is warm and dry. No rash noted.  Psychiatric: He has a normal mood and affect. His behavior is normal. Judgment and thought content normal.      Assessment & Plan:

## 2011-11-04 NOTE — Patient Instructions (Signed)
Good to see you today, call us with questions. Start flomax daily.  This may lower blood pressure. Return in 6 months for recheck blood pressure, sooner if needed. Pass by Marion's office to schedule CT scan of chest. Advanced directive information provided today.

## 2011-11-05 DIAGNOSIS — Z0000 Encounter for general adult medical examination without abnormal findings: Secondary | ICD-10-CM | POA: Insufficient documentation

## 2011-11-05 NOTE — Assessment & Plan Note (Signed)
Regular flomax use may lower BP.  Reassess next visit.

## 2011-11-05 NOTE — Assessment & Plan Note (Addendum)
Lab Results  Component Value Date   CREATININE 1.6* 10/28/2011  stable but elevated.  CKD stage 3. discussed importance of hydration status and avoidance of NSAIDs.

## 2011-11-05 NOTE — Assessment & Plan Note (Signed)
With ARF, resolved.  Remaining 3 mm R lower pole stone asxs (2012)

## 2011-11-05 NOTE — Assessment & Plan Note (Addendum)
I have personally reviewed the Medicare Annual Wellness questionnaire and have noted 1. The patient's medical and social history 2. Their use of alcohol, tobacco or illicit drugs 3. Their current medications and supplements 4. The patient's functional ability including ADL's, fall risks, home safety risks and hearing or visual impairment. 5. Diet and physical activity 6. Evidence for depression or mood disorders The patients weight, height, BMI have been recorded in the chart.  Hearing and vision has been addressed. I have made referrals, counseling and provided education to the patient based review of the above and I have provided the pt with a written personalized care plan for preventive services. See scanned questionairre. Advanced directives discussed:pt will look into this - packet provided.  Reviewed blood work in detail. Reviewed preventative protocols and updated unless pt declined. May d/c prostate and colon screening.  Hemoccult x1 neg today.

## 2011-11-05 NOTE — Assessment & Plan Note (Signed)
Enlarged prostate on exam.  No urinary sxs endorsed. rec start flomax. Have stopped PSA testing.

## 2011-11-05 NOTE — Assessment & Plan Note (Signed)
In h/o smoker found 6 mo ago incidentally. Will rpt ct chest to follow.

## 2011-11-05 NOTE — Assessment & Plan Note (Signed)
Yearly hearing screen.

## 2011-11-11 ENCOUNTER — Ambulatory Visit (INDEPENDENT_AMBULATORY_CARE_PROVIDER_SITE_OTHER)
Admission: RE | Admit: 2011-11-11 | Discharge: 2011-11-11 | Disposition: A | Discharge status: Home / Self Care | Payer: Medicare Other | Source: Ambulatory Visit | Attending: Family Medicine | Admitting: Family Medicine

## 2011-11-11 DIAGNOSIS — N259 Disorder resulting from impaired renal tubular function, unspecified: Secondary | ICD-10-CM

## 2011-11-11 DIAGNOSIS — R911 Solitary pulmonary nodule: Secondary | ICD-10-CM

## 2011-11-12 ENCOUNTER — Encounter: Payer: Self-pay | Admitting: Family Medicine

## 2012-02-13 ENCOUNTER — Ambulatory Visit (INDEPENDENT_AMBULATORY_CARE_PROVIDER_SITE_OTHER): Payer: Medicare Other | Admitting: Family Medicine

## 2012-02-13 ENCOUNTER — Encounter: Payer: Self-pay | Admitting: Family Medicine

## 2012-02-13 VITALS — BP 166/84 | HR 64 | Temp 98.2°F | Wt 189.2 lb

## 2012-02-13 DIAGNOSIS — M7989 Other specified soft tissue disorders: Secondary | ICD-10-CM | POA: Insufficient documentation

## 2012-02-13 LAB — COMPREHENSIVE METABOLIC PANEL
ALT: 11 U/L (ref 0–53)
AST: 19 U/L (ref 0–37)
Albumin: 3.8 g/dL (ref 3.5–5.2)
BUN: 32 mg/dL — ABNORMAL HIGH (ref 6–23)
Calcium: 9.3 mg/dL (ref 8.4–10.5)
Chloride: 105 mEq/L (ref 96–112)
Potassium: 4.2 mEq/L (ref 3.5–5.1)
Sodium: 142 mEq/L (ref 135–145)
Total Protein: 6.6 g/dL (ref 6.0–8.3)

## 2012-02-13 LAB — MICROALBUMIN / CREATININE URINE RATIO
Creatinine,U: 140 mg/dL
Microalb, Ur: 1.1 mg/dL (ref 0.0–1.9)

## 2012-02-13 MED ORDER — HYDROCHLOROTHIAZIDE 25 MG PO TABS: 25.0000 mg | ORAL_TABLET | ORAL | Status: DC

## 2012-02-13 MED ORDER — AMLODIPINE BESYLATE 5 MG PO TABS: 5.0000 mg | ORAL_TABLET | Freq: Every day | ORAL | Status: DC

## 2012-02-13 NOTE — Addendum Note (Signed)
Addended by: Alvina Chou on: 02/13/2012 02:07 PM   Modules accepted: Orders

## 2012-02-13 NOTE — Assessment & Plan Note (Signed)
In setting of recent increase in amlodipine, and edema seems centered around ankles so likely culprit.  Will decrease amlodipine to 5mg  daily, increase HCTZ to 25mg  daily. However in h/o HTN and CRI, also concern for deteriorating kidney function.   Therefore will also check CMP, urine microalb.   rtc 2 mo for f/u. Encouraged elevation, rest, fluids and avoid salt.

## 2012-02-13 NOTE — Progress Notes (Signed)
  Subjective:    Patient ID: Ronnie Brown, male    DOB: [birth year], 78 y.o.   MRN: [id number]  HPI CC: leg swelling  Pleasant 78 yo with h/o HTN, hearing impairment, BPH, CRI presents with several weeks of leg swelling.  Thinks left side more than right.  No pain, no redness, not itchy.  07/2011, amlodipine increased from 5mg  nightly to 10mg  nightly.  No chest pain, tightness, SOB, HA, LOB or dizziness.  Past Medical History  Diagnosis Date  . Hypertension   . BPH (benign prostatic hyperplasia)     s/p TURP 2006  . History of CT scan of abdomen 12/28/04    ? pneumonia, ? kidney stones  . History of CT pelvic 12/28/04    no stones, elev. prostate  . Solitary lung nodule 03/2011    7mm R lung base, stable on rpt CT 10/2011, consider rpt 2 yrs  . Kidney stone 03/2011    obstructive R upper ureteral stone s/p stent placement/ureteral dilatation, stent removal, likely passed.  remaining 3 mm R lower pole stone asxs  . Hearing impairment     bilateral severe sensori-neural, hearing aids, last audiology eval 06/2011  ([facility])  . CKD (chronic kidney disease) stage 3, GFR 30-59 ml/min 01/27/2007     Review of Systems Per HPI    Objective:   Physical Exam  Nursing note and vitals reviewed. Constitutional: He appears well-developed and well-nourished. No distress.  HENT:  Head: Normocephalic and atraumatic.  Mouth/Throat: Oropharynx is clear and moist. No oropharyngeal exudate.  Eyes: Conjunctivae and EOM are normal. Pupils are equal, round, and reactive to light. No scleral icterus.  Neck: Normal range of motion. Neck supple. Carotid bruit is not present.  Cardiovascular: Normal rate, regular rhythm, normal heart sounds and intact distal pulses.   No murmur heard. Pulmonary/Chest: Effort normal and breath sounds normal. No respiratory distress. He has no wheezes. He has no rales.       Crackles that clear with deep cough  Abdominal: Soft. Bowel sounds are normal. He exhibits no  distension. There is no hepatosplenomegaly. There is no tenderness. There is no rebound.  Musculoskeletal: He exhibits edema (1+ pitting bilateral, L>R).       No calf pain  Skin: Skin is warm and dry. No rash noted.       Assessment & Plan:

## 2012-02-13 NOTE — Patient Instructions (Signed)
I think this leg swelling is coming from amlodipine - so decrease to 5mg  nightly (or 1/2 tablet nightly) and new dose will be 5mg  so you can take a whole pill when you pick up new script. Increase hydrochlorothiazide (green pill) to 2 daily until you pick up new script and then that will be 1 pill daily of 25mg . Blood work today.  If kidney function remaining elevated, we will change hydrochlorothiazide to lasix (new stronger water pill). Ensure lots of water, keep legs up during the day, and avoid salt.  If this doesn't help, I may recommend you wear compression stockings (we can talk about that next time.)

## 2012-04-05 ENCOUNTER — Other Ambulatory Visit: Payer: Self-pay | Admitting: *Deleted

## 2012-04-05 MED ORDER — LISINOPRIL 20 MG PO TABS: ORAL_TABLET | ORAL | Status: DC

## 2012-04-05 NOTE — Telephone Encounter (Signed)
Received faxed refill request from pharmacy.

## 2012-05-05 ENCOUNTER — Ambulatory Visit: Payer: Medicare Other | Admitting: Family Medicine

## 2012-05-06 ENCOUNTER — Encounter: Payer: Self-pay | Admitting: Family Medicine

## 2012-05-06 ENCOUNTER — Ambulatory Visit (INDEPENDENT_AMBULATORY_CARE_PROVIDER_SITE_OTHER): Payer: Medicare Other | Admitting: Family Medicine

## 2012-05-06 VITALS — BP 120/60 | HR 72 | Temp 97.5°F | Ht 68.5 in | Wt 186.0 lb

## 2012-05-06 DIAGNOSIS — Z23 Encounter for immunization: Secondary | ICD-10-CM

## 2012-05-06 DIAGNOSIS — I10 Essential (primary) hypertension: Secondary | ICD-10-CM

## 2012-05-06 NOTE — Patient Instructions (Addendum)
Continue medicines as below. Let me know if any questions. Flu shot today. Cancel November appointment.

## 2012-05-06 NOTE — Assessment & Plan Note (Signed)
Has not been taking bp meds as instructed previously. Recommended he stop amlodipine 1/2 pill of 10mg  and only take 5mg  daily. Will ask him to return in 3 mo for recheck bp as he does not check at home. Anticipate increase in bp - if uncontrolled next visit, consider ACEI given h/o CRI.

## 2012-05-06 NOTE — Progress Notes (Signed)
  Subjective:    Patient ID: Ronnie Brown, male    DOB: 09-21-1922, 78 y.o.   MRN: [id number]  HPI CC: 3 mo f/u  Late yesterday to appt 30 min, today 45 min late, but it turns out his watch was 1 hour behind.  Pleasant 78 yo with h/o HTN, hearing impairment, BPH, CRI presents for 3 mo f/u.  Last visit, seen here with ankle swelling, thought due to amlodipine so this was cut in half and HCTZ was increased to 25mg  daily.  It actually turns out he has been taking both 5mg  amlodipine and 1/2 pill of 10mg  amlodipine daily.    Actually thinks that since he's backed down on eggs in the morning, leg swelling has improved.  HTN - No HA, vision changes, CP/tightness, SOB.  Doesn't check BPs at home. BP Readings from Last 3 Encounters:  05/06/12 120/60  02/13/12 166/84  11/04/11 150/80   CRI - knows to avoid NSAIDs and stay hydrated.  Notes increased fatigue when he walks long distance.  Review of Systems Per HPI    Objective:   Physical Exam  Nursing note and vitals reviewed. Constitutional: He appears well-developed and well-nourished. No distress.       Very hard of hearing  Musculoskeletal: He exhibits edema (mild pedal edema).       Assessment & Plan:

## 2012-05-17 ENCOUNTER — Ambulatory Visit: Payer: Medicare Other | Admitting: Family Medicine

## 2012-06-13 HISTORY — PX: OTHER SURGICAL HISTORY: SHX169

## 2012-06-21 ENCOUNTER — Other Ambulatory Visit: Payer: Self-pay | Admitting: *Deleted

## 2012-06-21 MED ORDER — LISINOPRIL 20 MG PO TABS: ORAL_TABLET | ORAL | Status: DC

## 2012-07-03 ENCOUNTER — Emergency Department (HOSPITAL_COMMUNITY): Payer: Medicare Other

## 2012-07-03 ENCOUNTER — Encounter (HOSPITAL_COMMUNITY): Payer: Self-pay | Admitting: *Deleted

## 2012-07-03 ENCOUNTER — Inpatient Hospital Stay (HOSPITAL_COMMUNITY)
Admission: EM | Admit: 2012-07-03 | Discharge: 2012-07-05 | DRG: 189 | Disposition: A | Discharge status: Home Health | Payer: Medicare Other | Attending: Internal Medicine | Admitting: Internal Medicine

## 2012-07-03 DIAGNOSIS — N039 Chronic nephritic syndrome with unspecified morphologic changes: Secondary | ICD-10-CM | POA: Diagnosis present

## 2012-07-03 DIAGNOSIS — R0602 Shortness of breath: Secondary | ICD-10-CM | POA: Diagnosis present

## 2012-07-03 DIAGNOSIS — I10 Essential (primary) hypertension: Secondary | ICD-10-CM | POA: Diagnosis present

## 2012-07-03 DIAGNOSIS — IMO0001 Reserved for inherently not codable concepts without codable children: Secondary | ICD-10-CM

## 2012-07-03 DIAGNOSIS — R911 Solitary pulmonary nodule: Secondary | ICD-10-CM

## 2012-07-03 DIAGNOSIS — R5381 Other malaise: Secondary | ICD-10-CM | POA: Diagnosis present

## 2012-07-03 DIAGNOSIS — J40 Bronchitis, not specified as acute or chronic: Secondary | ICD-10-CM

## 2012-07-03 DIAGNOSIS — N400 Enlarged prostate without lower urinary tract symptoms: Secondary | ICD-10-CM

## 2012-07-03 DIAGNOSIS — D631 Anemia in chronic kidney disease: Secondary | ICD-10-CM | POA: Diagnosis present

## 2012-07-03 DIAGNOSIS — H9190 Unspecified hearing loss, unspecified ear: Secondary | ICD-10-CM | POA: Diagnosis present

## 2012-07-03 DIAGNOSIS — N183 Chronic kidney disease, stage 3 unspecified: Secondary | ICD-10-CM

## 2012-07-03 DIAGNOSIS — F05 Delirium due to known physiological condition: Secondary | ICD-10-CM | POA: Diagnosis present

## 2012-07-03 DIAGNOSIS — N184 Chronic kidney disease, stage 4 (severe): Secondary | ICD-10-CM | POA: Diagnosis present

## 2012-07-03 DIAGNOSIS — N200 Calculus of kidney: Secondary | ICD-10-CM

## 2012-07-03 DIAGNOSIS — M7989 Other specified soft tissue disorders: Secondary | ICD-10-CM

## 2012-07-03 DIAGNOSIS — J441 Chronic obstructive pulmonary disease with (acute) exacerbation: Secondary | ICD-10-CM | POA: Diagnosis present

## 2012-07-03 DIAGNOSIS — J9600 Acute respiratory failure, unspecified whether with hypoxia or hypercapnia: Principal | ICD-10-CM | POA: Diagnosis present

## 2012-07-03 DIAGNOSIS — E860 Dehydration: Secondary | ICD-10-CM

## 2012-07-03 DIAGNOSIS — N19 Unspecified kidney failure: Secondary | ICD-10-CM

## 2012-07-03 DIAGNOSIS — J449 Chronic obstructive pulmonary disease, unspecified: Secondary | ICD-10-CM | POA: Diagnosis present

## 2012-07-03 DIAGNOSIS — J189 Pneumonia, unspecified organism: Secondary | ICD-10-CM | POA: Diagnosis present

## 2012-07-03 DIAGNOSIS — D72829 Elevated white blood cell count, unspecified: Secondary | ICD-10-CM

## 2012-07-03 DIAGNOSIS — I129 Hypertensive chronic kidney disease with stage 1 through stage 4 chronic kidney disease, or unspecified chronic kidney disease: Secondary | ICD-10-CM | POA: Diagnosis present

## 2012-07-03 DIAGNOSIS — Z0000 Encounter for general adult medical examination without abnormal findings: Secondary | ICD-10-CM

## 2012-07-03 DIAGNOSIS — R410 Disorientation, unspecified: Secondary | ICD-10-CM | POA: Diagnosis present

## 2012-07-03 LAB — CBC WITH DIFFERENTIAL/PLATELET
Basophils Relative: 0 % (ref 0–1)
Eosinophils Absolute: 0.1 10*3/uL (ref 0.0–0.7)
Eosinophils Relative: 0 % (ref 0–5)
MCH: 31.2 pg (ref 26.0–34.0)
MCHC: 33.7 g/dL (ref 30.0–36.0)
Neutrophils Relative %: 84 % — ABNORMAL HIGH (ref 43–77)
Platelets: 260 10*3/uL (ref 150–400)
RDW: 12.7 % (ref 11.5–15.5)

## 2012-07-03 LAB — COMPREHENSIVE METABOLIC PANEL
ALT: 10 U/L (ref 0–53)
Albumin: 3 g/dL — ABNORMAL LOW (ref 3.5–5.2)
Alkaline Phosphatase: 63 U/L (ref 39–117)
Calcium: 9.4 mg/dL (ref 8.4–10.5)
Potassium: 4.6 mEq/L (ref 3.5–5.1)
Sodium: 137 mEq/L (ref 135–145)
Total Protein: 7.1 g/dL (ref 6.0–8.3)

## 2012-07-03 LAB — MRSA PCR SCREENING: MRSA by PCR: NEGATIVE

## 2012-07-03 LAB — STREP PNEUMONIAE URINARY ANTIGEN: Strep Pneumo Urinary Antigen: NEGATIVE

## 2012-07-03 MED ORDER — SODIUM CHLORIDE 0.9 % IV BOLUS (SEPSIS)
500.0000 mL | Freq: Once | INTRAVENOUS | Status: AC
  Administered 2012-07-03: 500 mL via INTRAVENOUS

## 2012-07-03 MED ORDER — ONDANSETRON HCL 4 MG/2ML IJ SOLN: 4.0000 mg | INTRAMUSCULAR | Status: DC | PRN

## 2012-07-03 MED ORDER — AMLODIPINE BESYLATE 5 MG PO TABS
5.0000 mg | ORAL_TABLET | Freq: Every day | ORAL | Status: DC
  Administered 2012-07-03 – 2012-07-05 (×2): 5 mg via ORAL
  Filled 2012-07-03 (×4): qty 1

## 2012-07-03 MED ORDER — LISINOPRIL 20 MG PO TABS
20.0000 mg | ORAL_TABLET | Freq: Every day | ORAL | Status: DC
  Filled 2012-07-03: qty 1

## 2012-07-03 MED ORDER — TAMSULOSIN HCL 0.4 MG PO CAPS
0.4000 mg | ORAL_CAPSULE | Freq: Every day | ORAL | Status: DC
  Administered 2012-07-03: 0.4 mg via ORAL
  Filled 2012-07-03 (×2): qty 1

## 2012-07-03 MED ORDER — ALBUTEROL SULFATE (5 MG/ML) 0.5% IN NEBU: 2.5000 mg | INHALATION_SOLUTION | RESPIRATORY_TRACT | Status: DC | PRN

## 2012-07-03 MED ORDER — ALBUTEROL SULFATE (5 MG/ML) 0.5% IN NEBU
2.5000 mg | INHALATION_SOLUTION | Freq: Once | RESPIRATORY_TRACT | Status: AC
  Administered 2012-07-03: 2.5 mg via RESPIRATORY_TRACT
  Filled 2012-07-03: qty 0.5

## 2012-07-03 MED ORDER — IPRATROPIUM BROMIDE 0.02 % IN SOLN: 0.5000 mg | RESPIRATORY_TRACT | Status: DC | PRN

## 2012-07-03 MED ORDER — INFLUENZA VIRUS VACC SPLIT PF IM SUSP
0.5000 mL | INTRAMUSCULAR | Status: AC
  Administered 2012-07-04: 0.5 mL via INTRAMUSCULAR
  Filled 2012-07-03: qty 0.5

## 2012-07-03 MED ORDER — DEXTROSE 5 % IV SOLN
500.0000 mg | INTRAVENOUS | Status: DC
  Administered 2012-07-03: 500 mg via INTRAVENOUS
  Filled 2012-07-03: qty 500

## 2012-07-03 MED ORDER — DEXTROSE 5 % IV SOLN
1.0000 g | INTRAVENOUS | Status: DC
  Administered 2012-07-03: 1 g via INTRAVENOUS
  Filled 2012-07-03: qty 10

## 2012-07-03 MED ORDER — ONDANSETRON HCL 4 MG/2ML IJ SOLN
4.0000 mg | Freq: Once | INTRAMUSCULAR | Status: AC
  Administered 2012-07-03: 4 mg via INTRAVENOUS
  Filled 2012-07-03: qty 2

## 2012-07-03 MED ORDER — ALBUTEROL SULFATE (5 MG/ML) 0.5% IN NEBU
2.5000 mg | INHALATION_SOLUTION | Freq: Three times a day (TID) | RESPIRATORY_TRACT | Status: DC
  Administered 2012-07-03 – 2012-07-05 (×5): 2.5 mg via RESPIRATORY_TRACT
  Filled 2012-07-03 (×5): qty 0.5

## 2012-07-03 MED ORDER — HYDROCHLOROTHIAZIDE 25 MG PO TABS
25.0000 mg | ORAL_TABLET | Freq: Every day | ORAL | Status: DC
  Filled 2012-07-03: qty 1

## 2012-07-03 MED ORDER — LEVOFLOXACIN IN D5W 750 MG/150ML IV SOLN
750.0000 mg | INTRAVENOUS | Status: DC
  Administered 2012-07-03: 750 mg via INTRAVENOUS
  Filled 2012-07-03: qty 150

## 2012-07-03 MED ORDER — METHYLPREDNISOLONE SODIUM SUCC 125 MG IJ SOLR
80.0000 mg | Freq: Two times a day (BID) | INTRAMUSCULAR | Status: DC
  Administered 2012-07-03: 80 mg via INTRAVENOUS
  Filled 2012-07-03: qty 2
  Filled 2012-07-03 (×3): qty 1.28

## 2012-07-03 MED ORDER — ENOXAPARIN SODIUM 30 MG/0.3ML ~~LOC~~ SOLN
30.0000 mg | SUBCUTANEOUS | Status: DC
  Administered 2012-07-03 – 2012-07-04 (×2): 30 mg via SUBCUTANEOUS
  Filled 2012-07-03 (×3): qty 0.3

## 2012-07-03 MED ORDER — HYDROCHLOROTHIAZIDE 25 MG PO TABS: 25.0000 mg | ORAL_TABLET | ORAL | Status: DC

## 2012-07-03 MED ORDER — ACETAMINOPHEN 500 MG PO TABS
500.0000 mg | ORAL_TABLET | ORAL | Status: DC | PRN
  Administered 2012-07-04: 500 mg via ORAL
  Filled 2012-07-03: qty 1

## 2012-07-03 NOTE — ED Notes (Signed)
RN to obtain labs with start of IV 

## 2012-07-03 NOTE — H&P (Signed)
[facility] History and Physical  Ronnie Brown ZOX:[id number] DOB: 1923-02-08 DOA: 07/03/2012  Referring physician: ED physician PCP: [name], MD   Chief Complaint: Shortness of breath   HPI:  Pt is 78 yo male who presented to [facility] ED with main concern of progressively worsening shortness of breath that initially started one week prior to admission, associated with subjective fevers, chills, productive cough of yellow sputum, aggravated with exertion, with no specific alleviating factors. Pt also reports poor oral intake, nausea and non bloody vomiting. Pt denies other abdominal concerns, no urinary concerns, no other systemic symptoms.  In ED, pt found to be hypoxic, asked [facility] to admit for COPD/PNA.   Assessment and Plan: Principal Problem:  *Shortness of breath - this is mostly multifactorial in etiology and secondary to acute COPD exacerbation with ? PNA, imposed on chronic lung disease - will admit the pt to stepdown unit due to persistent hypoxia and will start with providing supportive care with oxygen, nebulizers as needed - will also treat for PNA empirically for now as the diagnosis is uncertain and will obtain sputum analysis, leukocytosis noted in ED and pt had fever - will likely be able to narrow down once clinical improvement noted - will also start Solumedrol due to wheezing, monitor CBGs to ensure proper control Active Problems:  COPD (chronic obstructive pulmonary disease) - unclear what stage pt is at but will evaluate this once he is more clinically stable - management with nebulizers, solumedrol, empiric ABX, oxygen  Leukocytosis - likely secondary to an infectious process acute bronchitis vs PNA - will cover with empiric antibiotics for now - CBC in AM  CKD (chronic kidney disease) stage 3, GFR 30-59 ml/min - creatinine is at baseline - BMP in AM  HYPERTENSION - continue home medication lisinopril, HCTZ, Norvasc - readjust the regimen as  indicated   Code Status: Full Family Communication: Pt at bedside Disposition Plan: Admit to SDU due to hypoxia   Review of Systems:  Constitutional: Positive for fever, chills and malaise/fatigue. Negative for diaphoresis.  HENT: Negative for hearing loss, ear pain, nosebleeds, congestion, sore throat, neck pain, tinnitus and ear discharge.   Eyes: Negative for blurred vision, double vision, photophobia, pain, discharge and redness.  Respiratory: Negative for hemoptysis, positive for cough with sputum production, shortness of breath, wheezing.   Cardiovascular: Negative for chest pain, palpitations, orthopnea, claudication and leg swelling.  Gastrointestinal: Positive for nausea, vomiting. Negative for heartburn, constipation, blood in stool and melena.  Genitourinary: Negative for dysuria, urgency, frequency, hematuria and flank pain.  Musculoskeletal: Negative for myalgias, back pain, joint pain and falls.  Skin: Negative for itching and rash.  Neurological: Negative for dizziness and weakness, tingling, tremors, sensory change, speech change, focal weakness, LOC, and headaches.  Endo/Heme/Allergies: Negative for environmental allergies and polydipsia. Does not bruise/bleed easily.  Psychiatric/Behavioral: Negative for suicidal ideas. The patient is not nervous/anxious.      Past Medical History  Diagnosis Date  . Hypertension   . BPH (benign prostatic hyperplasia)     s/p TURP 2006  . History of CT scan of abdomen 12/28/04    ? pneumonia, ? kidney stones  . History of CT pelvic 12/28/04    no stones, elev. prostate  . Solitary lung nodule 03/2011    7mm R lung base, stable on rpt CT 10/2011, consider rpt 2 yrs  . Kidney stone 03/2011    obstructive R upper ureteral stone s/p stent placement/ureteral dilatation, stent removal, likely passed.  remaining 3 mm R lower pole stone asxs  . Hearing impairment     bilateral severe sensori-neural, hearing aids, last audiology eval 06/2011   ([facility])  . CKD (chronic kidney disease) stage 3, GFR 30-59 ml/min 01/27/2007    Past Surgical History  Procedure Date  . Cystectomy 2002    right upper arm (Dr. Domenica Reamer)  . Cataract surgery 2001/03    Right 2001, Left laser 2003  . Patella fracture surgery 2003    Rehab  . Hospitalization 06/17-06/22/06    [facility], ? pneumonia, urinary retention, gastrocnemia, elevated PSA, ARI  . History of echo 12/29/04    EF 60%, no wall abnormality  . Transurethral resection of prostate 01/24/05    [facility]stedt  . Cystoscopy 08/19/06    flexible cystoscopy nonobstrutive  . Hospitalization 03/2011    R obstructing ureteral stone s/p cystoscopy, ureteroscopy, R ureteral stent placement, ARF, CKD stage 3, found pulm nodule    Social History:  reports that he has quit smoking. He has never used smokeless tobacco. He reports that he does not drink alcohol or use illicit drugs.  No Known Allergies  Family History  Problem Relation Age of Onset  . Heart disease Mother     heart trouble, arrhythmia  . Diabetes Mother   . Cancer Father     skin in mouth  . Heart disease Sister     CAD  . Diabetes Sister   . Heart disease Brother     A. Fib  . Heart disease Daughter     CAD  . Dementia Daughter   . Hypertension Brother   . Cancer Son     lymphoma  . Stroke Neg Hx     Prior to Admission medications   Medication Sig Start Date End Date Taking? Authorizing Provider  acetaminophen (TYLENOL) 500 MG tablet Take 500 mg by mouth as needed.     Yes Historical Provider, MD  amLODipine (NORVASC) 5 MG tablet Take 1 tablet (5 mg total) by mouth at bedtime. 02/13/12  Yes [name], MD  Cholecalciferol (VITAMIN D3) 2000 UNITS capsule Take 1,000 Units by mouth daily.    Yes Historical Provider, MD  hydrochlorothiazide (HYDRODIURIL) 25 MG tablet Take 1 tablet (25 mg total) by mouth every morning. 02/13/12  Yes [name], MD  lisinopril (PRINIVIL,ZESTRIL) 20 MG tablet Take one by mouth every  am and 1/2 every pm 06/21/12  Yes [name], MD  Tamsulosin HCl (FLOMAX) 0.4 MG CAPS Take 1 capsule (0.4 mg total) by mouth daily. 11/04/11  Yes [name], MD    Physical Exam: Filed Vitals:   07/03/12 1310 07/03/12 1326 07/03/12 1428 07/03/12 1429  BP: 143/54  115/49 115/49  Pulse: 93  82 80  Temp: 99.1 F (37.3 C)     TempSrc: Oral     Resp: 17  16 23   Height: 5\' 10"  (1.778 m)     Weight: 83.915 kg (185 lb)     SpO2: 89% 95% 96% 95%    Physical Exam  Constitutional: Appears well-developed and well-nourished. No distress.  HENT: Normocephalic. External right and left ear normal. Oropharynx is clear and moist.  Eyes: Conjunctivae and EOM are normal. PERRLA, no scleral icterus.  Neck: Normal ROM. Neck supple. No JVD. No tracheal deviation. No thyromegaly.  CVS: RRR, S1/S2 +, no murmurs, no gallops, no carotid bruit.  Pulmonary: Course breath sounds with wheezing inspiratory and expiratory, no tachypnea  Abdominal: Soft. BS +,  no distension, tenderness, rebound  or guarding.  Musculoskeletal: Normal range of motion. +1 bilateral lower extremity edema, no tenderness.  Lymphadenopathy: No lymphadenopathy noted, cervical, inguinal. Neuro: Alert. Normal reflexes, muscle tone coordination. No cranial nerve deficit. Skin: Skin is warm and dry. No rash noted. Not diaphoretic. No erythema. No pallor.  Psychiatric: Normal mood and affect. Behavior, judgment, thought content normal.   Labs on Admission:  Basic Metabolic Panel:  Lab 07/03/12 6962  NA 137  K 4.6  CL 101  CO2 24  GLUCOSE 127*  BUN 50*  CREATININE 2.10*  CALCIUM 9.4  MG --  PHOS --   Liver Function Tests:  Lab 07/03/12 1338  AST 21  ALT 10  ALKPHOS 63  BILITOT 0.6  PROT 7.1  ALBUMIN 3.0*   CBC:  Lab 07/03/12 1338  WBC 15.9*  NEUTROABS 13.4*  HGB 12.4*  HCT 36.8*  MCV 92.7  PLT 260    Radiological Exams on Admission: Dg Chest 2 View  07/03/2012  *RADIOLOGY REPORT*  Clinical Data:  Cough, hypoxia  CHEST - 2 VIEW  Comparison: 03/30/2011  Findings: Borderline enlargement of cardiac silhouette. Mediastinal contours and bowel gas pattern normal. Peribronchial thickening and emphysematous changes noted. Bibasilar atelectasis. Minimal chronic accentuation of interstitial markings stable. No acute infiltrate, pleural effusion, or pneumothorax. Biapical scarring present.  IMPRESSION: Changes of COPD and chronic bronchitis with biapical scarring. Minimal bibasilar atelectasis.   Original Report Authenticated By: Ulyses Southward, M.D.     EKG: Normal sinus rhythm, no ST/T wave changes  Debbora Presto, MD  [facility] Pager [phone]  If 7PM-7AM, please contact night-coverage [email or web address] Password [facility] 07/03/2012, 3:41 PM

## 2012-07-03 NOTE — Progress Notes (Signed)
IV from ED infiltrated on assessment at 1930. New IV placed without difficulty.

## 2012-07-03 NOTE — ED Notes (Signed)
Pt c/o congestion, vomiting, shortness of breath x1 week, low grade fever

## 2012-07-03 NOTE — ED Provider Notes (Addendum)
History     CSN: [id number]  Arrival date & time 07/03/12  1245   First MD Initiated Contact with Patient 07/03/12 1325      Chief Complaint  Patient presents with  . Emesis    (Consider location/radiation/quality/duration/timing/severity/associated sxs/prior treatment) HPI Comments: Patient presents with one-week history of worsening cough and chest congestion. He's also had some frequent episodes of nausea and vomiting and decreased appetite.he hasn't been able to keep anything down in the last few days. He denies any known fevers at home but hasn't checked his temperature. He does have some shortness of breath which has been worsening over last few days. He denies a history of past lung disease. He is not on oxygen at home. He denies any chest pain or pleuritic-type pain. He denies any leg swelling or pain. He has had some chills and myalgias.he tried to see his primary care physician who is in [place], [place] however he was unable to be seen last week.   Past Medical History  Diagnosis Date  . Hypertension   . BPH (benign prostatic hyperplasia)     s/p TURP 2006  . History of CT scan of abdomen 12/28/04    ? pneumonia, ? kidney stones  . History of CT pelvic 12/28/04    no stones, elev. prostate  . Solitary lung nodule 03/2011    7mm R lung base, stable on rpt CT 10/2011, consider rpt 2 yrs  . Kidney stone 03/2011    obstructive R upper ureteral stone s/p stent placement/ureteral dilatation, stent removal, likely passed.  remaining 3 mm R lower pole stone asxs  . Hearing impairment     bilateral severe sensori-neural, hearing aids, last audiology eval 06/2011  ([facility])  . CKD (chronic kidney disease) stage 3, GFR 30-59 ml/min 01/27/2007    Past Surgical History  Procedure Date  . Cystectomy 2002    right upper arm (Dr. Domenica Reamer)  . Cataract surgery 2001/03    Right 2001, Left laser 2003  . Patella fracture surgery 2003    Rehab  . Hospitalization  06/17-06/22/06    [facility], ? pneumonia, urinary retention, gastrocnemia, elevated PSA, ARI  . History of echo 12/29/04    EF 60%, no wall abnormality  . Transurethral resection of prostate 01/24/05    [facility]  . Cystoscopy 08/19/06    flexible cystoscopy nonobstrutive  . Hospitalization 03/2011    R obstructing ureteral stone s/p cystoscopy, ureteroscopy, R ureteral stent placement, ARF, CKD stage 3, found pulm nodule    Family History  Problem Relation Age of Onset  . Heart disease Mother     heart trouble, arrhythmia  . Diabetes Mother   . Cancer Father     skin in mouth  . Heart disease Sister     CAD  . Diabetes Sister   . Heart disease Brother     A. Fib  . Heart disease Daughter     CAD  . Dementia Daughter   . Hypertension Brother   . Cancer Son     lymphoma  . Stroke Neg Hx     History  Substance Use Topics  . Smoking status: Former Smoker -- 1.5 packs/day for 30 years  . Smokeless tobacco: Never Used     Comment: Quit 1974  . Alcohol Use: No      Review of Systems  Constitutional: Positive for chills and fatigue. Negative for fever and diaphoresis.  HENT: Negative for congestion, rhinorrhea and sneezing.   Eyes:  Negative.   Respiratory: Positive for cough, shortness of breath and wheezing. Negative for chest tightness.   Cardiovascular: Negative for chest pain and leg swelling.  Gastrointestinal: Positive for nausea and vomiting. Negative for abdominal pain, diarrhea and blood in stool.  Genitourinary: Negative for frequency, hematuria, flank pain and difficulty urinating.  Musculoskeletal: Positive for myalgias. Negative for back pain and arthralgias.  Skin: Negative for rash.  Neurological: Negative for dizziness, speech difficulty, weakness, numbness and headaches.    Allergies  Review of patient's allergies indicates no known allergies.  Home Medications   Current Outpatient Rx  Name  Route  Sig  Dispense  Refill  . ACETAMINOPHEN 500 MG PO  TABS   Oral   Take 500 mg by mouth as needed.           Marland Kitchen AMLODIPINE BESYLATE 5 MG PO TABS   Oral   Take 1 tablet (5 mg total) by mouth at bedtime.   30 tablet   6   . VITAMIN D3 2000 UNITS PO CAPS   Oral   Take 1,000 Units by mouth daily.          Marland Kitchen HYDROCHLOROTHIAZIDE 25 MG PO TABS   Oral   Take 1 tablet (25 mg total) by mouth every morning.   30 tablet   6   . LISINOPRIL 20 MG PO TABS      Take one by mouth every am and 1/2 every pm   45 tablet   2   . TAMSULOSIN HCL 0.4 MG PO CAPS   Oral   Take 1 capsule (0.4 mg total) by mouth daily.   30 capsule   11     BP 115/49  Pulse 82  Temp 99.1 F (37.3 C) (Oral)  Resp 16  Ht 5\' 10"  (1.778 m)  Wt 185 lb (83.915 kg)  BMI 26.54 kg/m2  SpO2 96%  Physical Exam  Constitutional: He is oriented to person, place, and time. He appears well-developed and well-nourished.  HENT:  Head: Normocephalic and atraumatic.       Dry mucous membranes  Eyes: Conjunctivae normal are normal. Pupils are equal, round, and reactive to light.  Neck: Normal range of motion. Neck supple.  Cardiovascular: Normal rate, regular rhythm and normal heart sounds.   Pulmonary/Chest: Effort normal. No respiratory distress. He has wheezes. He has no rales. He exhibits no tenderness.       Positive rhonchi bilaterally and expiratory wheezes bilaterally  Abdominal: Soft. Bowel sounds are normal. There is no tenderness. There is no rebound and no guarding.  Musculoskeletal: Normal range of motion. He exhibits no edema.  Lymphadenopathy:    He has no cervical adenopathy.  Neurological: He is alert and oriented to person, place, and time.  Skin: Skin is warm and dry. No rash noted.  Psychiatric: He has a normal mood and affect.    ED Course  Procedures (including critical care time)  Results for orders placed during the hospital encounter of 07/03/12  CBC WITH DIFFERENTIAL      Component Value Range   WBC 15.9 (*) 4.0 - 10.5 K/uL   RBC 3.97  (*) 4.22 - 5.81 MIL/uL   Hemoglobin 12.4 (*) 13.0 - 17.0 g/dL   HCT 16.1 (*) 09.6 - 04.5 %   MCV 92.7  78.0 - 100.0 fL   MCH 31.2  26.0 - 34.0 pg   MCHC 33.7  30.0 - 36.0 g/dL   RDW 40.9  81.1 - 91.4 %  Platelets 260  150 - 400 K/uL   Neutrophils Relative 84 (*) 43 - 77 %   Neutro Abs 13.4 (*) 1.7 - 7.7 K/uL   Lymphocytes Relative 6 (*) 12 - 46 %   Lymphs Abs 1.0  0.7 - 4.0 K/uL   Monocytes Relative 9  3 - 12 %   Monocytes Absolute 1.5 (*) 0.1 - 1.0 K/uL   Eosinophils Relative 0  0 - 5 %   Eosinophils Absolute 0.1  0.0 - 0.7 K/uL   Basophils Relative 0  0 - 1 %   Basophils Absolute 0.0  0.0 - 0.1 K/uL  COMPREHENSIVE METABOLIC PANEL      Component Value Range   Sodium 137  135 - 145 mEq/L   Potassium 4.6  3.5 - 5.1 mEq/L   Chloride 101  96 - 112 mEq/L   CO2 24  19 - 32 mEq/L   Glucose, Bld 127 (*) 70 - 99 mg/dL   BUN 50 (*) 6 - 23 mg/dL   Creatinine, Ser 7.82 (*) 0.50 - 1.35 mg/dL   Calcium 9.4  8.4 - 95.6 mg/dL   Total Protein 7.1  6.0 - 8.3 g/dL   Albumin 3.0 (*) 3.5 - 5.2 g/dL   AST 21  0 - 37 U/L   ALT 10  0 - 53 U/L   Alkaline Phosphatase 63  39 - 117 U/L   Total Bilirubin 0.6  0.3 - 1.2 mg/dL   GFR calc non Af Amer 27 (*) >90 mL/min   GFR calc Af Amer 31 (*) >90 mL/min   Dg Chest 2 View  07/03/2012  *RADIOLOGY REPORT*  Clinical Data: Cough, hypoxia  CHEST - 2 VIEW  Comparison: 03/30/2011  Findings: Borderline enlargement of cardiac silhouette. Mediastinal contours and bowel gas pattern normal. Peribronchial thickening and emphysematous changes noted. Bibasilar atelectasis. Minimal chronic accentuation of interstitial markings stable. No acute infiltrate, pleural effusion, or pneumothorax. Biapical scarring present.  IMPRESSION: Changes of COPD and chronic bronchitis with biapical scarring. Minimal bibasilar atelectasis.   Original Report Authenticated By: Ulyses Southward, M.D.     Date: 07/03/2012  Rate: 79  Rhythm: normal sinus rhythm  QRS Axis: normal  Intervals:  normal  ST/T Wave abnormalities: nonspecific ST/T changes  Conduction Disutrbances:none  Narrative Interpretation:   Old EKG Reviewed: unchanged    1. Bronchitis   2. Renal failure   3. Dehydration       MDM  Patient with bronchitis-like symptoms. There is no evidence of pneumonia on chest x-ray. He does appear to be dehydrated and has had worsening symptoms over the last week. He also has some hypoxia with oxygen saturations of 89% on room air however he is at 97% on nasal cannula 2 L. He was hydrated here with normal saline he was given a albuterol treatment. I feel that with his ongoing vomiting and hypoxia that he would benefit from admission and will consult hospitalist for admission.  His creatinine is elevated, but on review of records, over the last few years, has ranged from 1.6-2.6.  His BUN today is higher than normal, suggestion some prerenal component        Rolan Bucco, MD 07/03/12 1441  Rolan Bucco, MD 07/03/12 1455

## 2012-07-03 NOTE — ED Notes (Signed)
Pt comes from home where he lives alone with c/o N/V that began Thursday past.  Pt also states he has had cough productive of yellow sputum x1 week.  Pt denies pain.  Pt has expiratory wheezing and diminished breath sounds.  Pt has good peripheral pulses.  Pt is hard of hearing, but is alert and oriented.

## 2012-07-03 NOTE — ED Notes (Signed)
Report called to Rafael Hernandez, [place] 1O. Asked MD Effie Shy about patient placement on stepdown ICU.

## 2012-07-04 ENCOUNTER — Encounter (HOSPITAL_COMMUNITY): Payer: Self-pay | Admitting: *Deleted

## 2012-07-04 DIAGNOSIS — I10 Essential (primary) hypertension: Secondary | ICD-10-CM

## 2012-07-04 LAB — CBC
MCH: 30.8 pg (ref 26.0–34.0)
MCHC: 33 g/dL (ref 30.0–36.0)
MCV: 93.3 fL (ref 78.0–100.0)
Platelets: 243 10*3/uL (ref 150–400)
RDW: 12.5 % (ref 11.5–15.5)
WBC: 12.3 10*3/uL — ABNORMAL HIGH (ref 4.0–10.5)

## 2012-07-04 LAB — BASIC METABOLIC PANEL
CO2: 24 mEq/L (ref 19–32)
Calcium: 8.8 mg/dL (ref 8.4–10.5)
Creatinine, Ser: 2.19 mg/dL — ABNORMAL HIGH (ref 0.50–1.35)

## 2012-07-04 MED ORDER — BIOTENE DRY MOUTH MT LIQD
15.0000 mL | Freq: Two times a day (BID) | OROMUCOSAL | Status: DC
  Administered 2012-07-04 (×2): 15 mL via OROMUCOSAL

## 2012-07-04 MED ORDER — LEVOFLOXACIN 750 MG PO TABS
750.0000 mg | ORAL_TABLET | ORAL | Status: DC
  Administered 2012-07-04: 750 mg via ORAL
  Filled 2012-07-04: qty 1

## 2012-07-04 MED ORDER — PREDNISONE 20 MG PO TABS
40.0000 mg | ORAL_TABLET | Freq: Every day | ORAL | Status: DC
  Administered 2012-07-04 – 2012-07-05 (×2): 40 mg via ORAL
  Filled 2012-07-04 (×4): qty 2

## 2012-07-04 MED ORDER — TAMSULOSIN HCL 0.4 MG PO CAPS
0.8000 mg | ORAL_CAPSULE | Freq: Every day | ORAL | Status: DC
  Administered 2012-07-04: 0.8 mg via ORAL
  Filled 2012-07-04 (×2): qty 2

## 2012-07-04 NOTE — Progress Notes (Signed)
[facility] PROGRESS NOTE  Ronnie Brown ZOX:[id number] DOB: 1922/08/01 DOA: 07/03/2012 PCP: [name], MD  Assessment/Plan: Acute respiratory failure/Shortness of breath  Multifactorial in etiology and secondary to acute COPD exacerbation with ? PNA, imposed on chronic lung disease.  Improved with steroids, antibiotics and neb treatments.  As respiratory status has improved from yesterday, transition the patient at stepdown.  COPD (chronic obstructive pulmonary disease) exacerbation Improved with steroids, levofloxacin and neb treatments.  Wean patient off oxygen as tolerated.  Define five-day course of antibiotics.  Transition IV Solu-Medrol to prednisone.  Will see if patient requires oxygen prior to discharge.  Leukocytosis  Improved from yesterday.  Continue to monitor.  Likely due to COPD exacerbation.  CKD (chronic kidney disease) stage 3, GFR 30-59 ml/min  Has baseline creatinine ranging from 1.6 - 2.6.  Continue to monitor.  HYPERTENSION  Stable.  Continue home amlodipine.  Hold lisinopril and hydrochlorothiazide due to mild elevation of creatinine.  Anemia Likely due to chronic kidney disease.  Continue to monitor.  Hard of hearing Has hearing aids.  Continue to monitor.  BPH Continue tamsulosin.  Code Status: Full  Family Communication: Pt at bedside  Disposition Plan: Transfer out of stepdown to MedSurg, no events on telemetry.  Consultants:  None  Procedures:  None  Antibiotics:  Ceftriaxone/azithromycin 07/03/2012 >> 07/04/2012  Levofloxacin 07/04/2012 >>  HPI/Subjective: Breathing better.  Hard of hearing.  No other specific complaints.  Objective: Filed Vitals:   07/04/12 0205 07/04/12 0400 07/04/12 0517 07/04/12 0800  BP: 96/45   109/49  Pulse: 69   73  Temp:  97.3 F (36.3 C)  97.3 F (36.3 C)  TempSrc:  Oral  Oral  Resp: 18   20  Height:      Weight:      SpO2: 93%  96% 93%    Intake/Output Summary (Last 24 hours) at  07/04/12 0859 Last data filed at 07/04/12 0800  Gross per 24 hour  Intake    430 ml  Output    250 ml  Net    180 ml   Filed Weights   07/03/12 1310 07/03/12 1649  Weight: 83.915 kg (185 lb) 80.2 kg (176 lb 12.9 oz)    Physical Exam: General: Awake, Oriented, No acute distress. HEENT: EOMI. Neck: Supple CV: S1 and S2 Lungs: Scattered expiratory wheezing, moderate air movement bilaterally.   Abdomen: Soft, Nontender, Nondistended, +bowel sounds. Ext: Good pulses. Trace edema.  Data Reviewed: Basic Metabolic Panel:  Lab 07/04/12 8119 07/03/12 1338  NA 134* 137  K 5.0 4.6  CL 100 101  CO2 24 24  GLUCOSE 175* 127*  BUN 55* 50*  CREATININE 2.19* 2.10*  CALCIUM 8.8 9.4  MG -- --  PHOS -- --   Liver Function Tests:  Lab 07/03/12 1338  AST 21  ALT 10  ALKPHOS 63  BILITOT 0.6  PROT 7.1  ALBUMIN 3.0*   No results found for this basename: LIPASE:5,AMYLASE:5 in the last 168 hours No results found for this basename: AMMONIA:5 in the last 168 hours CBC:  Lab 07/04/12 0339 07/03/12 1338  WBC 12.3* 15.9*  NEUTROABS -- 13.4*  HGB 11.5* 12.4*  HCT 34.8* 36.8*  MCV 93.3 92.7  PLT 243 260   Cardiac Enzymes: No results found for this basename: CKTOTAL:5,CKMB:5,CKMBINDEX:5,TROPONINI:5 in the last 168 hours BNP (last 3 results) No results found for this basename: PROBNP:3 in the last 8760 hours CBG: No results found for this basename: GLUCAP:5 in the last 168  hours  Recent Results (from the past 240 hour(s))  MRSA PCR SCREENING     Status: Normal   Collection Time   07/03/12  4:53 PM      Component Value Range Status Comment   MRSA by PCR NEGATIVE  NEGATIVE Final   CULTURE, EXPECTORATED SPUTUM-ASSESSMENT     Status: Normal   Collection Time   07/03/12  5:34 PM      Component Value Range Status Comment   Specimen Description SPUTUM   Final    Special Requests Normal   Final    Sputum evaluation     Final    Value: THIS SPECIMEN IS ACCEPTABLE. RESPIRATORY CULTURE  REPORT TO FOLLOW.   Report Status 07/03/2012 FINAL   Final      Studies: Dg Chest 2 View  07/03/2012  *RADIOLOGY REPORT*  Clinical Data: Cough, hypoxia  CHEST - 2 VIEW  Comparison: 03/30/2011  Findings: Borderline enlargement of cardiac silhouette. Mediastinal contours and bowel gas pattern normal. Peribronchial thickening and emphysematous changes noted. Bibasilar atelectasis. Minimal chronic accentuation of interstitial markings stable. No acute infiltrate, pleural effusion, or pneumothorax. Biapical scarring present.  IMPRESSION: Changes of COPD and chronic bronchitis with biapical scarring. Minimal bibasilar atelectasis.   Original Report Authenticated By: Ulyses Southward, M.D.     Scheduled Meds:   . albuterol  2.5 mg Nebulization Q8H  . amLODipine  5 mg Oral QHS  . antiseptic oral rinse  15 mL Mouth Rinse BID  . enoxaparin  30 mg Subcutaneous Q24H  . influenza  inactive virus vaccine  0.5 mL Intramuscular Tomorrow-1000  . levofloxacin  750 mg Oral Q48H  . predniSONE  40 mg Oral Q breakfast  . Tamsulosin HCl  0.8 mg Oral Daily   Continuous Infusions:   Principal Problem:  *Shortness of breath Active Problems:  HYPERTENSION  CKD (chronic kidney disease) stage 3, GFR 30-59 ml/min  COPD (chronic obstructive pulmonary disease)  Leukocytosis  Time spent: 25 minutes  [name]  [facility] Pager [phone]. If 8PM-8AM, please contact night-coverage at [email or web address], password [facility] 07/04/2012, 8:59 AM  LOS: 1 day

## 2012-07-04 NOTE — Progress Notes (Signed)
Patient transferring to room 1340.  Report called to Onalee Hua, RN.  Patient to travel by wheelchair.  Patient's son notified of room change.  Will continue to monitor.

## 2012-07-04 NOTE — Progress Notes (Signed)
Patient's daughter Sedalia Muta wishes to be called for all discharge planning at [phone].  Philomena Doheny RN

## 2012-07-05 ENCOUNTER — Telehealth: Payer: Self-pay | Admitting: Family Medicine

## 2012-07-05 DIAGNOSIS — R410 Disorientation, unspecified: Secondary | ICD-10-CM | POA: Diagnosis present

## 2012-07-05 DIAGNOSIS — R404 Transient alteration of awareness: Secondary | ICD-10-CM

## 2012-07-05 LAB — CBC
HCT: 36.9 % — ABNORMAL LOW (ref 39.0–52.0)
Hemoglobin: 12.4 g/dL — ABNORMAL LOW (ref 13.0–17.0)
MCH: 30.8 pg (ref 26.0–34.0)
MCV: 91.6 fL (ref 78.0–100.0)
RBC: 4.03 MIL/uL — ABNORMAL LOW (ref 4.22–5.81)

## 2012-07-05 LAB — BASIC METABOLIC PANEL
CO2: 22 mEq/L (ref 19–32)
Calcium: 9.4 mg/dL (ref 8.4–10.5)
Creatinine, Ser: 2.16 mg/dL — ABNORMAL HIGH (ref 0.50–1.35)
Glucose, Bld: 185 mg/dL — ABNORMAL HIGH (ref 70–99)

## 2012-07-05 MED ORDER — ALBUTEROL SULFATE (5 MG/ML) 0.5% IN NEBU: 2.5000 mg | INHALATION_SOLUTION | Freq: Four times a day (QID) | RESPIRATORY_TRACT | Status: DC | PRN

## 2012-07-05 MED ORDER — LEVOFLOXACIN 750 MG PO TABS: 750.0000 mg | ORAL_TABLET | ORAL | Status: DC

## 2012-07-05 MED ORDER — IPRATROPIUM BROMIDE 0.02 % IN SOLN: 0.5000 mg | Freq: Four times a day (QID) | RESPIRATORY_TRACT | Status: DC | PRN

## 2012-07-05 MED ORDER — PREDNISONE 10 MG PO TABS: ORAL_TABLET | ORAL | Status: DC

## 2012-07-05 MED ORDER — ALBUTEROL SULFATE HFA 108 (90 BASE) MCG/ACT IN AERS: 2.0000 | INHALATION_SPRAY | Freq: Four times a day (QID) | RESPIRATORY_TRACT | Status: DC | PRN

## 2012-07-05 NOTE — Discharge Summary (Signed)
Physician Discharge Summary  Ronnie Brown:[id number] DOB: 03/29/1923 DOA: 07/03/2012  PCP: [name], MD  Admit date: 07/03/2012 Discharge date: 07/05/2012  Time spent: 35 minutes  Recommendations for Outpatient Follow-up:  Please followup with [name], MD (PCP) in 1 week. Please do not drive until you see your primary care physician. If confusion/altered mental status worsens please followup with [name], MD (PCP) or come back to ER for evaluation.  Home health PT/OT/RN/Social worker arranged at discharge.  Discharge Diagnoses:  Principal Problem:  *Shortness of breath Active Problems:  HYPERTENSION  CKD (chronic kidney disease) stage 3, GFR 30-59 ml/min  COPD (chronic obstructive pulmonary disease)  Leukocytosis   Discharge Condition: Stable  Diet recommendation: Heart healthy diet  Filed Weights   07/03/12 1649 07/04/12 1048 07/05/12 0520  Weight: 80.2 kg (176 lb 12.9 oz) 79.833 kg (176 lb) 81 kg (178 lb 9.2 oz)    History of present illness:  On admission: "Pt is 78 yo male who presented to [facility] ED with main concern of progressively worsening shortness of breath that initially started one week prior to admission, associated with subjective fevers, chills, productive cough of yellow sputum, aggravated with exertion, with no specific alleviating factors. Pt also reports poor oral intake, nausea and non bloody vomiting. Pt denies other abdominal concerns, no urinary concerns, no other systemic symptoms. In ED, pt found to be hypoxic, asked [facility] to admit for COPD/PNA."   Hospital Course:  Acute respiratory failure/Shortness of breath  Multifactorial in etiology and secondary to acute COPD exacerbation with ? PNA, imposed on chronic lung disease.  Improved with steroids, antibiotics and neb treatments.  As respiratory status has improved from from admission, patient was transitioned out of stepdown.  COPD (chronic obstructive pulmonary disease)  exacerbation Improved with steroids, levofloxacin and neb treatments.  Weaned patient off oxygen as tolerated, maintained saturation greater than 93% on room air prior to discharge.  Defined five-day course of antibiotics.  Transition IV Solu-Medrol to prednisone, which will be tapered as outpatient. Arrange for neb treatments for home use prior to discharge.  Leukocytosis  Likely due to COPD exacerbation and steroids.  CKD (chronic kidney disease) stage 3, GFR 30-59 ml/min  Has baseline creatinine ranging from 1.6 - 2.6.  Continue to monitor.  HYPERTENSION  Stable.  Continue home amlodipine.  Resume lisinopril and hydrochlorothiazide at discharge.  Generalized weakness PT recommended SNF. However family indicated that they can provide 24 hours supervision at home as a result will arrange for home health PT/OT/RN/Social worker after discharge.  Anemia Likely due to chronic kidney disease.  Continue to monitor. Hemoglobin stable.   Hard of hearing Has hearing aids.  Continue to monitor.  BPH Continue tamsulosin.  Acute delirium Likely multifactorial due to hart of hearing, COPD exacerbation, and steroids. Neurologically intact, may improve with treatment of COPD exacerbation and once back in home environment. Instructed the patient (with family) not to let patient drive until cleared by PCP.  Code Status: Full  Family Communication: Patient and family updated at bedside. Family will stay with the patient for 24 hours for the next week. Disposition Plan:  Discharge the patient today.  Consultants:  None  Procedures:  None  Antibiotics:  Ceftriaxone/azithromycin 07/03/2012 >> 07/04/2012  Levofloxacin 07/04/2012 >> Till 07/08/2012.  Discharge Exam: Filed Vitals:   07/04/12 2249 07/05/12 0501 07/05/12 0520 07/05/12 1050  BP: 124/53  104/57   Pulse: 74  78   Temp:   97.9 F (36.6 C)  TempSrc:   Oral   Resp: 20  20   Height:      Weight:   81 kg (178 lb 9.2 oz)    SpO2:  96% 90% 93%   Discharge Instructions  Discharge Orders    Future Appointments: Provider: Department: Dept Phone: Center:   08/06/2012 12:00 PM [name], MD [facility] [phone] [id number]     Future Orders Please Complete By Expires   Diet - low sodium heart healthy      Increase activity slowly      Discharge instructions      Comments:   Please followup with [name], MD (PCP) in 1 week. Please do not drive until you see your primary care physician. If confusion/altered mental status worsens please followup with [name], MD (PCP) or come back to ER for evaluation.       Medication List     As of 07/05/2012 12:51 PM    TAKE these medications         albuterol (5 MG/ML) 0.5% nebulizer solution   Commonly known as: PROVENTIL   Take 0.5 mLs (2.5 mg total) by nebulization every 6 (six) hours as needed for wheezing or shortness of breath.      albuterol 108 (90 BASE) MCG/ACT inhaler   Commonly known as: PROVENTIL HFA;VENTOLIN HFA   Inhale 2 puffs into the lungs every 6 (six) hours as needed for wheezing.      amLODipine 5 MG tablet   Commonly known as: NORVASC   Take 1 tablet (5 mg total) by mouth at bedtime.      hydrochlorothiazide 25 MG tablet   Commonly known as: HYDRODIURIL   Take 1 tablet (25 mg total) by mouth every morning.      ipratropium 0.02 % nebulizer solution   Commonly known as: ATROVENT   Take 2.5 mLs (0.5 mg total) by nebulization every 6 (six) hours as needed.      levofloxacin 750 MG tablet   Commonly known as: LEVAQUIN   Take 1 tablet (750 mg total) by mouth every other day.      lisinopril 20 MG tablet   Commonly known as: PRINIVIL,ZESTRIL   Take one by mouth every am and 1/2 every pm      predniSONE 10 MG tablet   Commonly known as: DELTASONE   Take 40 mg daily for 1 day, then 20 mg daily for 2 days, then 10 mg daily for 2 days, then 5 mg daily for 2 days then discontinue.       Tamsulosin HCl 0.4 MG Caps   Commonly known as: FLOMAX   Take 1 capsule (0.4 mg total) by mouth daily.      TYLENOL 500 MG tablet   Generic drug: acetaminophen   Take 500 mg by mouth as needed.      Vitamin D3 2000 UNITS capsule   Take 1,000 Units by mouth daily.          The results of significant diagnostics from this hospitalization (including imaging, microbiology, ancillary and laboratory) are listed below for reference.    Significant Diagnostic Studies: Dg Chest 2 View  07/03/2012  *RADIOLOGY REPORT*  Clinical Data: Cough, hypoxia  CHEST - 2 VIEW  Comparison: 03/30/2011  Findings: Borderline enlargement of cardiac silhouette. Mediastinal contours and bowel gas pattern normal. Peribronchial thickening and emphysematous changes noted. Bibasilar atelectasis. Minimal chronic accentuation of interstitial markings stable. No acute infiltrate, pleural effusion, or pneumothorax. Biapical scarring present.  IMPRESSION: Changes of COPD and chronic bronchitis with biapical scarring. Minimal bibasilar atelectasis.   Original Report Authenticated By: Ulyses Southward, M.D.     Microbiology: Recent Results (from the past 240 hour(s))  MRSA PCR SCREENING     Status: Normal   Collection Time   07/03/12  4:53 PM      Component Value Range Status Comment   MRSA by PCR NEGATIVE  NEGATIVE Final   CULTURE, BLOOD (ROUTINE X 2)     Status: Normal (Preliminary result)   Collection Time   07/03/12  5:01 PM      Component Value Range Status Comment   Specimen Description BLOOD LEFT ARM   Final    Special Requests BOTTLES DRAWN AEROBIC AND ANAEROBIC 5CC EACH   Final    Culture  Setup Time 07/03/2012 23:24   Final    Culture     Final    Value:        BLOOD CULTURE RECEIVED NO GROWTH TO DATE CULTURE WILL BE HELD FOR 5 DAYS BEFORE ISSUING A FINAL NEGATIVE REPORT   Report Status PENDING   Incomplete   CULTURE, BLOOD (ROUTINE X 2)     Status: Normal (Preliminary result)   Collection Time   07/03/12   5:16 PM      Component Value Range Status Comment   Specimen Description BLOOD RIGHT ARM   Final    Special Requests BOTTLES DRAWN AEROBIC AND ANAEROBIC 3CC EACH   Final    Culture  Setup Time 07/03/2012 23:24   Final    Culture     Final    Value:        BLOOD CULTURE RECEIVED NO GROWTH TO DATE CULTURE WILL BE HELD FOR 5 DAYS BEFORE ISSUING A FINAL NEGATIVE REPORT   Report Status PENDING   Incomplete   CULTURE, EXPECTORATED SPUTUM-ASSESSMENT     Status: Normal   Collection Time   07/03/12  5:34 PM      Component Value Range Status Comment   Specimen Description SPUTUM   Final    Special Requests Normal   Final    Sputum evaluation     Final    Value: THIS SPECIMEN IS ACCEPTABLE. RESPIRATORY CULTURE REPORT TO FOLLOW.   Report Status 07/03/2012 FINAL   Final   CULTURE, RESPIRATORY     Status: Normal (Preliminary result)   Collection Time   07/03/12  5:34 PM      Component Value Range Status Comment   Specimen Description TRACHEAL ASPIRATE   Final    Special Requests NONE   Final    Gram Stain PENDING   Incomplete    Culture Culture reincubated for better growth   Final    Report Status PENDING   Incomplete      Labs: Basic Metabolic Panel:  Lab 07/05/12 9604 07/04/12 0339 07/03/12 1338  NA 134* 134* 137  K 4.2 5.0 4.6  CL 99 100 101  CO2 22 24 24   GLUCOSE 185* 175* 127*  BUN 67* 55* 50*  CREATININE 2.16* 2.19* 2.10*  CALCIUM 9.4 8.8 9.4  MG -- -- --  PHOS -- -- --   Liver Function Tests:  Lab 07/03/12 1338  AST 21  ALT 10  ALKPHOS 63  BILITOT 0.6  PROT 7.1  ALBUMIN 3.0*   No results found for this basename: LIPASE:5,AMYLASE:5 in the last 168 hours No results found for this basename: AMMONIA:5 in the last 168 hours CBC:  Lab 07/05/12 0406 07/04/12  1610 07/03/12 1338  WBC 21.4* 12.3* 15.9*  NEUTROABS -- -- 13.4*  HGB 12.4* 11.5* 12.4*  HCT 36.9* 34.8* 36.8*  MCV 91.6 93.3 92.7  PLT 267 243 260   Cardiac Enzymes: No results found for this basename:  CKTOTAL:5,CKMB:5,CKMBINDEX:5,TROPONINI:5 in the last 168 hours BNP: BNP (last 3 results) No results found for this basename: PROBNP:3 in the last 8760 hours CBG: No results found for this basename: GLUCAP:5 in the last 168 hours     Signed:  Demani Mcbrien A  [facility] 07/05/2012, 12:51 PM

## 2012-07-05 NOTE — Telephone Encounter (Signed)
Received discharge summary from hospitalization for COPD/PNA Can we call pt/fam to see how he's doing, ensure no further confusion, and request f/u within 1-2 wks?  Thanks.

## 2012-07-05 NOTE — Progress Notes (Signed)
Spoke with pt, son and daughter at bedside concerning discharge orders.  Pt discharged home with son and daughter.  Son states he will stay with pt. Daughter selected [facility] for DME and Home Health needs.

## 2012-07-05 NOTE — Progress Notes (Signed)
Clinical Social Work Department BRIEF PSYCHOSOCIAL ASSESSMENT 07/05/2012  Patient:  Ronnie Brown, Ronnie Brown     Account Number:  [id number]     Admit date:  07/03/2012  Clinical Social Worker:  Jacelyn Grip  Date/Time:  07/05/2012 12:40 PM  Referred by:  RN  Date Referred:  07/05/2012 Referred for  SNF Placement   Other Referral:   Interview type:  Family Other interview type:    PSYCHOSOCIAL DATA Living Status:  ALONE Admitted from facility:   Level of care:   Primary support name:  Diane Kirkman/daughter Primary support relationship to patient:  CHILD, ADULT Degree of support available:   adequate    CURRENT CONCERNS Current Concerns  Post-Acute Placement   Other Concerns:    SOCIAL WORK ASSESSMENT / PLAN CSW received referral for new SNF placement. CSW met with pt, pt daughter, and pt son at bedside.    CSW discussed with pt and pt family re: discharge planning. CSW discussed recommendation for rehab at SNF before returning home mainly due to pt confusion and poor judgement at this time. Pt daughter reports that pt confusion is new and feels that the new enviornment is contributing to confusion. Pt children feel that pt confusion will decrease when he returns home to his home environment. CSW inquired if pt family can provide 24 hour care and pt son plans to stay with pt for the next week to provide assistance. Pt and pt family agreeable to home health services including a HH SW in the case that pt needs cannot continue to be met in the home. MD joined conversation and plan is for home with [facility] today. RNCM notified. CSW provided pt family CSW contact information for any questions that may arise and HH SW will follow up with pt and pt family in the home. No further social work needs identified at this time. CSW signing off.   Assessment/plan status:  No Further Intervention Required Other assessment/ plan:   Information/referral to community resources:   Referral to [facility]     PATIENT'S/FAMILY'S RESPONSE TO PLAN OF CARE: Pt alert and oriented x4, but hard of hearing. Pt family supportive and actively involved in pt care and plan to provide 24 hour assist for pt at time of discharge.        Jacklynn Lewis, MSW, LCSWA  Clinical Social Work [phone]

## 2012-07-05 NOTE — Progress Notes (Signed)
[facility] PROGRESS NOTE  Ronnie Brown WUJ:[id number] DOB: [birth year] DOA: 07/03/2012 PCP: [name], MD  Assessment/Plan: Acute respiratory failure/Shortness of breath  Multifactorial in etiology and secondary to acute COPD exacerbation with ? PNA, imposed on chronic lung disease.  Improved with steroids, antibiotics and neb treatments.  As respiratory status has improved from yesterday, transition the patient at stepdown.  COPD (chronic obstructive pulmonary disease) exacerbation Improved with steroids, levofloxacin and neb treatments.  Wean patient off oxygen as tolerated.  Define five-day course of antibiotics.  Transition IV Solu-Medrol to prednisone.  Will see if patient requires oxygen prior to discharge.  Leukocytosis  Likely due to COPD exacerbation and steroids.  CKD (chronic kidney disease) stage 3, GFR 30-59 ml/min  Has baseline creatinine ranging from 1.6 - 2.6.  Continue to monitor.  HYPERTENSION  Stable.  Continue home amlodipine.  Hold lisinopril and hydrochlorothiazide due to mild elevation of creatinine.  Anemia Likely due to chronic kidney disease.  Continue to monitor.  Hard of hearing Has hearing aids.  Continue to monitor.  BPH Continue tamsulosin.  Acute delirium Likely multifactorial due to hart of hearing, COPD exacerbation, and steroids. Neurologically intact, may improve with treatment of COPD exacerbation. Instructed the patient (with family) not to let patient drive until cleared by PCP.  Code Status: Full  Family Communication: Patient and family updated at bedside. Family will stay with the patient for 24 hours for the next week. Disposition Plan:  Discharge the patient today.  Consultants:  None  Procedures:  None  Antibiotics:  Ceftriaxone/azithromycin 07/03/2012 >> 07/04/2012  Levofloxacin 07/04/2012 >>  HPI/Subjective: Breathing better. Hard of hearing, was slightly confused this morning.  Objective: Filed  Vitals:   07/04/12 2128 07/04/12 2249 07/05/12 0501 07/05/12 0520  BP:  124/53  104/57  Pulse:  74  78  Temp:    97.9 F (36.6 C)  TempSrc:    Oral  Resp:  20  20  Height:      Weight:    81 kg (178 lb 9.2 oz)  SpO2: 95%  96% 90%    Intake/Output Summary (Last 24 hours) at 07/05/12 0946 Last data filed at 07/05/12 0752  Gross per 24 hour  Intake    250 ml  Output    700 ml  Net   -450 ml   Filed Weights   07/03/12 1649 07/04/12 1048 07/05/12 0520  Weight: 80.2 kg (176 lb 12.9 oz) 79.833 kg (176 lb) 81 kg (178 lb 9.2 oz)    Physical Exam: General: Awake, Oriented, No acute distress. HEENT: EOMI. Neck: Supple CV: S1 and S2 Lungs: No wheezing, moderate air movement bilaterally.   Abdomen: Soft, Nontender, Nondistended, +bowel sounds. Ext: Good pulses. Trace edema.  Data Reviewed: Basic Metabolic Panel:  Lab 07/05/12 9562 07/04/12 0339 07/03/12 1338  NA 134* 134* 137  K 4.2 5.0 4.6  CL 99 100 101  CO2 22 24 24   GLUCOSE 185* 175* 127*  BUN 67* 55* 50*  CREATININE 2.16* 2.19* 2.10*  CALCIUM 9.4 8.8 9.4  MG -- -- --  PHOS -- -- --   Liver Function Tests:  Lab 07/03/12 1338  AST 21  ALT 10  ALKPHOS 63  BILITOT 0.6  PROT 7.1  ALBUMIN 3.0*   No results found for this basename: LIPASE:5,AMYLASE:5 in the last 168 hours No results found for this basename: AMMONIA:5 in the last 168 hours CBC:  Lab 07/05/12 0406 07/04/12 0339 07/03/12 1338  WBC 21.4* 12.3* 15.9*  NEUTROABS -- -- 13.4*  HGB 12.4* 11.5* 12.4*  HCT 36.9* 34.8* 36.8*  MCV 91.6 93.3 92.7  PLT 267 243 260   Cardiac Enzymes: No results found for this basename: CKTOTAL:5,CKMB:5,CKMBINDEX:5,TROPONINI:5 in the last 168 hours BNP (last 3 results) No results found for this basename: PROBNP:3 in the last 8760 hours CBG: No results found for this basename: GLUCAP:5 in the last 168 hours  Recent Results (from the past 240 hour(s))  MRSA PCR SCREENING     Status: Normal   Collection Time   07/03/12   4:53 PM      Component Value Range Status Comment   MRSA by PCR NEGATIVE  NEGATIVE Final   CULTURE, BLOOD (ROUTINE X 2)     Status: Normal (Preliminary result)   Collection Time   07/03/12  5:01 PM      Component Value Range Status Comment   Specimen Description BLOOD LEFT ARM   Final    Special Requests BOTTLES DRAWN AEROBIC AND ANAEROBIC 5CC EACH   Final    Culture  Setup Time 07/03/2012 23:24   Final    Culture     Final    Value:        BLOOD CULTURE RECEIVED NO GROWTH TO DATE CULTURE WILL BE HELD FOR 5 DAYS BEFORE ISSUING A FINAL NEGATIVE REPORT   Report Status PENDING   Incomplete   CULTURE, BLOOD (ROUTINE X 2)     Status: Normal (Preliminary result)   Collection Time   07/03/12  5:16 PM      Component Value Range Status Comment   Specimen Description BLOOD RIGHT ARM   Final    Special Requests BOTTLES DRAWN AEROBIC AND ANAEROBIC 3CC EACH   Final    Culture  Setup Time 07/03/2012 23:24   Final    Culture     Final    Value:        BLOOD CULTURE RECEIVED NO GROWTH TO DATE CULTURE WILL BE HELD FOR 5 DAYS BEFORE ISSUING A FINAL NEGATIVE REPORT   Report Status PENDING   Incomplete   CULTURE, EXPECTORATED SPUTUM-ASSESSMENT     Status: Normal   Collection Time   07/03/12  5:34 PM      Component Value Range Status Comment   Specimen Description SPUTUM   Final    Special Requests Normal   Final    Sputum evaluation     Final    Value: THIS SPECIMEN IS ACCEPTABLE. RESPIRATORY CULTURE REPORT TO FOLLOW.   Report Status 07/03/2012 FINAL   Final   CULTURE, RESPIRATORY     Status: Normal (Preliminary result)   Collection Time   07/03/12  5:34 PM      Component Value Range Status Comment   Specimen Description TRACHEAL ASPIRATE   Final    Special Requests NONE   Final    Gram Stain PENDING   Incomplete    Culture Culture reincubated for better growth   Final    Report Status PENDING   Incomplete      Studies: Dg Chest 2 View  07/03/2012  *RADIOLOGY REPORT*  Clinical Data: Cough,  hypoxia  CHEST - 2 VIEW  Comparison: 03/30/2011  Findings: Borderline enlargement of cardiac silhouette. Mediastinal contours and bowel gas pattern normal. Peribronchial thickening and emphysematous changes noted. Bibasilar atelectasis. Minimal chronic accentuation of interstitial markings stable. No acute infiltrate, pleural effusion, or pneumothorax. Biapical scarring present.  IMPRESSION: Changes of COPD and chronic bronchitis with biapical scarring. Minimal bibasilar atelectasis.  Original Report Authenticated By: Ulyses Southward, M.D.     Scheduled Meds:    . albuterol  2.5 mg Nebulization Q8H  . amLODipine  5 mg Oral QHS  . antiseptic oral rinse  15 mL Mouth Rinse BID  . enoxaparin  30 mg Subcutaneous Q24H  . levofloxacin  750 mg Oral Q48H  . predniSONE  40 mg Oral Q breakfast  . Tamsulosin HCl  0.8 mg Oral Daily   Continuous Infusions:   Principal Problem:  *Shortness of breath Active Problems:  HYPERTENSION  CKD (chronic kidney disease) stage 3, GFR 30-59 ml/min  COPD (chronic obstructive pulmonary disease)  Leukocytosis  Time spent: 25 minutes  Lorinda Copland A  [facility] Pager [phone]. If 8PM-8AM, please contact night-coverage at [email or web address], password [facility] 07/05/2012, 9:46 AM  LOS: 2 days

## 2012-07-05 NOTE — Progress Notes (Signed)
OT Cancellation Note  Patient Details Name: Ronnie Brown MRN: [id number] DOB: 1923-02-24   Cancelled Treatment:    Reason Eval/Treat Not Completed: Other (comment). Pt discharging home with family and HHOT/PT. Please re-order if status changes.  Sophiarose Eades A OTR/L [phone] 07/05/2012, 2:00 PM

## 2012-07-05 NOTE — Evaluation (Signed)
Physical Therapy Evaluation Patient Details Name: Ronnie Brown MRN: [id number] DOB: 1922/09/23 Today's Date: 07/05/2012 Time: 4098-1191 PT Time Calculation (min): 34 min  PT Assessment / Plan / Recommendation Clinical Impression  78 yo male admitted with SOB who also is at significantly high  risk to fall if he walks walks without an assistive device.  He displays poor judgement with use of RW and has some cognitive impairment.  He will benefit from continued PT and needs 24/7 assist.  Recommend continued PT at SNF.    PT Assessment  Patient needs continued PT services    Follow Up Recommendations  SNF    Does the patient have the potential to tolerate intense rehabilitation      Barriers to Discharge Decreased caregiver support pt lives alone     Equipment Recommendations  Rolling walker with 5" wheels    Recommendations for Other Services OT consult   Frequency Min 3X/week    Precautions / Restrictions Precautions Precautions: Fall Precaution Comments: pt observed to have major loss of balance when moving on his own in the room to throw away a tissue.  He has to forcefully grab onto bedrail and needed by assist to prevent a fall Restrictions Weight Bearing Restrictions: No   Pertinent Vitals/Pain No c/o pain      Mobility  Transfers Transfers: Sit to Stand;Stand to Sit Sit to Stand: 5: Supervision Stand to Sit: 5: Supervision Ambulation/Gait Ambulation/Gait Assistance: 5: Supervision Ambulation Distance (Feet): 200 Feet Assistive device: Rolling walker Ambulation/Gait Assistance Details: cues to use RW correctly Gait Pattern: Step-through pattern General Gait Details: pt appears to be easily distracted  and does not use walker safely .Marland Kitchen..he does not use it at the times he most needs it: when turning and moving around in his room when he needs to have a handhold Stairs: No Wheelchair Mobility Wheelchair Mobility: No    Shoulder Instructions     Exercises  Other Exercises Other Exercises: standing balance tolerance  for core activation   PT Diagnosis: Difficulty walking;Generalized weakness;Abnormality of gait;Altered mental status  PT Problem List: Decreased activity tolerance;Decreased balance;Decreased safety awareness;Decreased knowledge of use of DME;Decreased cognition PT Treatment Interventions: DME instruction;Gait training;Functional mobility training;Therapeutic activities;Therapeutic exercise;Balance training;Patient/family education   PT Goals Acute Rehab PT Goals PT Goal Formulation: Patient unable to participate in goal setting Time For Goal Achievement: 07/19/12 Potential to Achieve Goals: Fair Pt will Roll Supine to Left Side: with modified independence PT Goal: Rolling Supine to Left Side - Progress: Goal set today Pt will go Sit to Supine/Side: with modified independence PT Goal: Sit to Supine/Side - Progress: Goal set today Pt will Stand: Independently PT Goal: Stand - Progress: Goal set today Pt will Ambulate: >150 feet;with modified independence;with least restrictive assistive device PT Goal: Ambulate - Progress: Goal set today  Visit Information  Last PT Received On: 07/05/12    Subjective Data  Subjective: It almost time for [facility]! (pt thought it was Dec. 24) Patient Stated Goal: pt said he does not think he is ready for a "home", ? if he changes the subject when asked about a SNF for rehab or if he is unaware of his situation   Prior Functioning  Home Living Lives With: Alone Type of Home: House Home Access: Stairs to enter Entrance Stairs-Number of Steps: 2 Entrance Stairs-Rails:  (pt states rails are rusted out) Home Layout: One level Home Adaptive Equipment: Straight cane Additional Comments: pt states he just bought himself a cane, but he's  afraid he'll leave it somewhere Prior Function Level of Independence: Independent with assistive device(s) Comments: pt reports a history of falls but  states it was the reason he quit his job???  Communication Communication: HOH    Cognition  Overall Cognitive Status: Impaired Area of Impairment: Memory;Following commands;Safety/judgement;Awareness of errors;Awareness of deficits;Problem solving Arousal/Alertness: Awake/alert Orientation Level: Disoriented to;Place;Time;Situation Behavior During Session: [facility] for tasks performed Memory Deficits: pt unable to remember to use walker or nurse call button Following Commands: Follows one step commands inconsistently Safety/Judgement: Decreased awareness of safety precautions Safety/Judgement - Other Comments: pt moves suddenly, does not consistently use RW Awareness of Errors: Assistance required to identify errors made;Assistance required to correct errors made    Extremity/Trunk Assessment Right Lower Extremity Assessment RLE ROM/Strength/Tone: [facility] for tasks assessed Left Lower Extremity Assessment LLE ROM/Strength/Tone: [facility] for tasks assessed   Balance Standardized Balance Assessment Standardized Balance Assessment: Berg Balance Test Berg Balance Test Sit to Stand: Able to stand  independently using hands Standing Unsupported: Able to stand 2 minutes with supervision Sitting with Back Unsupported but Feet Supported on Floor or Stool: Able to sit safely and securely 2 minutes Stand to Sit: Sits safely with minimal use of hands Transfers: Able to transfer safely, definite need of hands Standing Unsupported with Eyes Closed: Able to stand 10 seconds with supervision Standing Ubsupported with Feet Together: Needs help to attain position and unable to hold for 15 seconds From Standing, Reach Forward with Outstretched Arm: Can reach forward >12 cm safely (5") From Standing Position, Pick up Object from Floor: Able to pick up shoe, needs supervision From Standing Position, Turn to Look Behind Over each Shoulder: Needs supervision when turning Turn 360 Degrees: Needs close supervision or  verbal cueing Standing Unsupported, Alternately Place Feet on Step/Stool: Able to complete >2 steps/needs minimal assist Standing Unsupported, One Foot in Front: Needs help to step but can hold 15 seconds Standing on One Leg: Tries to lift leg/unable to hold 3 seconds but remains standing independently Total Score: 31   End of Session PT - End of Session Activity Tolerance: Patient limited by fatigue Patient left: in chair;with chair alarm set Nurse Communication: Mobility status  GP     Bayard Hugger. [place], [place]  [phone] 07/05/2012, 11:08 AM

## 2012-07-06 LAB — LEGIONELLA ANTIGEN, URINE: Legionella Antigen, Urine: NEGATIVE

## 2012-07-06 NOTE — Telephone Encounter (Signed)
Spoke with patient's son who said patient is doing much better now and seems to be almost back to himself. He asked that I call patient's daughter to schedule appt as she will be the one to bring him and he doesn't know her schedule. I called and left her a message to return my call to schedule hospital follow up.

## 2012-07-09 LAB — CULTURE, BLOOD (ROUTINE X 2): Culture: NO GROWTH

## 2012-07-15 ENCOUNTER — Ambulatory Visit (INDEPENDENT_AMBULATORY_CARE_PROVIDER_SITE_OTHER): Payer: Medicare Other | Admitting: Family Medicine

## 2012-07-15 ENCOUNTER — Encounter: Payer: Self-pay | Admitting: Family Medicine

## 2012-07-15 VITALS — BP 120/60 | HR 92 | Temp 97.9°F | Wt 175.0 lb

## 2012-07-15 DIAGNOSIS — J449 Chronic obstructive pulmonary disease, unspecified: Secondary | ICD-10-CM

## 2012-07-15 DIAGNOSIS — H9190 Unspecified hearing loss, unspecified ear: Secondary | ICD-10-CM

## 2012-07-15 DIAGNOSIS — IMO0001 Reserved for inherently not codable concepts without codable children: Secondary | ICD-10-CM

## 2012-07-15 DIAGNOSIS — I10 Essential (primary) hypertension: Secondary | ICD-10-CM

## 2012-07-15 DIAGNOSIS — N183 Chronic kidney disease, stage 3 unspecified: Secondary | ICD-10-CM

## 2012-07-15 NOTE — Progress Notes (Signed)
Subjective:    Patient ID: Ronnie Brown, male    DOB: 1923-01-13, 79 y.o.   MRN: [id number]  HPI CC: hosp f/u  Very poor of hearing at baseline despite hearing aides.  Ronnie Brown presents today as hospital follow up for recent hospitalization for COPD exacerbation and possible superimposed pneumonia that improved with steroids, levofloxacin and nebulizer treatment.  Presented with nausea/vomiting and diarrhea then progressed to SOB, coughing, confusion.  Initially admitted to stepdown unit 2/2 acute respiratory failure with hypoxia.  Has completed abx and prednisone course.  All records reviewed.  Has been using neb treatments regularly for last week.  blcx neg x 2, resp cx neg x1.  Coughing has improved since discharge.  Denies fevers/chills, wheezing, shortness of breath.  H/o smoking - quit remotely.    At discharge, SNF was recommended but pt opted to go home with family care. HHPT has come out to home - rec use cane when walking around yard. Satisfied with strength displayed.  Likely will complete therapy next week. HHRN coming a few times a week as well. HHSW - to meet them this afternoon.  Driving - does very little driving.  Doesn't get on highways.  Already restricted to driving during the day.    No cell phone.  HH suggested life alert or other alerting system.  To meet with SW later today. Advance directives - discussed at CPE, had not looked into this.  Discussed again today.  Would not want prolonged life support, but would want other treatments like cpr "if there's hope".  D/C summary Admit date: 07/03/2012  Discharge date: 07/05/2012  Follow up phone call: 07/06/2012  Recommendations for Outpatient Follow-up:  Please followup with Ronnie Boyden, MD (PCP) in 1 week. Please do not drive until you see your primary care physician. If confusion/altered mental status worsens please followup with Ronnie Boyden, MD (PCP) or come back to ER for evaluation.  Home health  PT/OT/RN/Social worker arranged at discharge.  Discharge Diagnoses:  Principal Problem:  *Shortness of breath  Active Problems:  HYPERTENSION  CKD (chronic kidney disease) stage 3, GFR 30-59 ml/min  COPD (chronic obstructive pulmonary disease)  Leukocytosis  Discharge Condition: Stable  Diet recommendation: Heart healthy diet  Filed Weights    07/03/12 1649  07/04/12 1048  07/05/12 0520   Weight:  80.2 kg (176 lb 12.9 oz)  79.833 kg (176 lb)  81 kg (178 lb 9.2 oz)   History of present illness:  On admission: "Pt is 78 yo male who presented to [facility] ED with main concern of progressively worsening shortness of breath that initially started one week prior to admission, associated with subjective fevers, chills, productive cough of yellow sputum, aggravated with exertion, with no specific alleviating factors. Pt also reports poor oral intake, nausea and non bloody vomiting. Pt denies other abdominal concerns, no urinary concerns, no other systemic symptoms. In ED, pt found to be hypoxic, asked TRH to admit for COPD/PNA."  Hospital Course:  Acute respiratory failure/Shortness of breath  Multifactorial in etiology and secondary to acute COPD exacerbation with ? PNA, imposed on chronic lung disease. Improved with steroids, antibiotics and neb treatments. As respiratory status has improved from from admission, patient was transitioned out of stepdown.  COPD (chronic obstructive pulmonary disease) exacerbation  Improved with steroids, levofloxacin and neb treatments. Weaned patient off oxygen as tolerated, maintained saturation greater than 93% on room air prior to discharge. Defined five-day course of antibiotics. Transition IV Solu-Medrol to prednisone, which will  be tapered as outpatient. Arrange for neb treatments for home use prior to discharge.  Leukocytosis  Likely due to COPD exacerbation and steroids.  CKD (chronic kidney disease) stage 3, GFR 30-59 ml/min  Has baseline creatinine ranging from  1.6 - 2.6. Continue to monitor.  HYPERTENSION  Stable. Continue home amlodipine. Resume lisinopril and hydrochlorothiazide at discharge.  Generalized weakness  PT recommended SNF. However family indicated that they can provide 24 hours supervision at home as a result will arrange for home health PT/OT/RN/Social worker after discharge.  Anemia  Likely due to chronic kidney disease. Continue to monitor. Hemoglobin stable.  Hard of hearing  Has hearing aids. Continue to monitor.  BPH  Continue tamsulosin.  Acute delirium  Likely multifactorial due to hard of hearing, COPD exacerbation, and steroids. Neurologically intact, may improve with treatment of COPD exacerbation and once back in home environment. Instructed the patient (with family) not to let patient drive until cleared by PCP.  Code Status: Full  Family Communication: Patient and family updated at bedside. Family will stay with the patient for 24 hours for the next week.  Disposition Plan: Discharge the patient today.  Consultants:  None Procedures:  None Antibiotics:  Ceftriaxone/azithromycin 07/03/2012 >> 07/04/2012 Levofloxacin 07/04/2012 >> Till 07/08/2012.  Medication List      As of 07/05/2012 12:51 PM     TAKE these medications        albuterol (5 MG/ML) 0.5% nebulizer solution     Commonly known as: PROVENTIL     Take 0.5 mLs (2.5 mg total) by nebulization every 6 (six) hours as needed for wheezing or shortness of breath.     albuterol 108 (90 BASE) MCG/ACT inhaler     Commonly known as: PROVENTIL HFA;VENTOLIN HFA     Inhale 2 puffs into the lungs every 6 (six) hours as needed for wheezing.     amLODipine 5 MG tablet     Commonly known as: NORVASC     Take 1 tablet (5 mg total) by mouth at bedtime.     hydrochlorothiazide 25 MG tablet     Commonly known as: HYDRODIURIL     Take 1 tablet (25 mg total) by mouth every morning.     ipratropium 0.02 % nebulizer solution     Commonly known as: ATROVENT     Take 2.5  mLs (0.5 mg total) by nebulization every 6 (six) hours as needed.     levofloxacin 750 MG tablet     Commonly known as: LEVAQUIN     Take 1 tablet (750 mg total) by mouth every other day.     lisinopril 20 MG tablet     Commonly known as: PRINIVIL,ZESTRIL     Take one by mouth every am and 1/2 every pm     predniSONE 10 MG tablet     Commonly known as: DELTASONE     Take 40 mg daily for 1 day, then 20 mg daily for 2 days, then 10 mg daily for 2 days, then 5 mg daily for 2 days then discontinue.     Tamsulosin HCl 0.4 MG Caps     Commonly known as: FLOMAX     Take 1 capsule (0.4 mg total) by mouth daily.     TYLENOL 500 MG tablet     Generic drug: acetaminophen     Take 500 mg by mouth as needed.     Vitamin D3 2000 UNITS capsule     Take 1,000 Units by mouth  daily.       Past Medical History  Diagnosis Date  . Hypertension   . BPH (benign prostatic hyperplasia)     s/p TURP 2006  . History of CT scan of abdomen 12/28/04    ? pneumonia, ? kidney stones  . History of CT pelvic 12/28/04    no stones, elev. prostate  . Solitary lung nodule 03/2011    7mm R lung base, stable on rpt CT 10/2011, consider rpt 2 yrs  . Kidney stone 03/2011    obstructive R upper ureteral stone s/p stent placement/ureteral dilatation, stent removal, likely passed.  remaining 3 mm R lower pole stone asxs  . Hearing impairment     bilateral severe sensori-neural, hearing aids, last audiology eval 06/2011  ([facility])  . CKD (chronic kidney disease) stage 3, GFR 30-59 ml/min 01/27/2007    Review of Systems Per HPI    Objective:   Physical Exam  Nursing note and vitals reviewed. Constitutional: He is oriented to person, place, and time. He appears well-developed and well-nourished. No distress.  HENT:  Head: Normocephalic and atraumatic.  Right Ear: External ear normal.  Left Ear: External ear normal.  Mouth/Throat: Oropharynx is clear and moist. No oropharyngeal exudate.       TMs clear    Eyes: Conjunctivae normal and EOM are normal. Pupils are equal, round, and reactive to light.  Neck: Normal range of motion. Neck supple.  Cardiovascular: Normal rate, regular rhythm, normal heart sounds and intact distal pulses.   No murmur heard. Pulmonary/Chest: Effort normal and breath sounds normal. No respiratory distress. He has no wheezes. He has no rales.       Crackles that clear with deep breath/cough  Lymphadenopathy:    He has no cervical adenopathy.  Neurological: He is alert and oriented to person, place, and time.  Skin: Skin is warm and dry. No rash noted.      Assessment & Plan:

## 2012-07-15 NOTE — Patient Instructions (Signed)
Good to see you today. Let's limit driving to a few miles around your house , and you need to ensure that you are hearing well to drive safely. Check with hearing doctor next visit for driving safety. Return at end of month for follow up.

## 2012-07-16 ENCOUNTER — Encounter: Payer: Self-pay | Admitting: Family Medicine

## 2012-07-16 NOTE — Assessment & Plan Note (Addendum)
New diagnosis on CXR this hospitalization.  Has completed levaquin and prednisone course, advised may space out neb treatments (albuterol/atrovent) from now on, monitor for continued improvement. Remote smoking hx. Consider spirometry at f/u visit if resp status improved.

## 2012-07-16 NOTE — Assessment & Plan Note (Signed)
Sees audiologist every 6 mo.  i've asked them to check with audiologist next visit re: safety in driving given profound hearing loss, I've asked pt to limit driving to <4 miles around house.

## 2012-07-16 NOTE — Assessment & Plan Note (Addendum)
Chronic, stable. Continue meds. Reports compliance with all BP meds.

## 2012-07-16 NOTE — Assessment & Plan Note (Addendum)
Chronic.  Some elevation from previous baseline of 1.6. Emphasized importance of hydration status and avoidance of NSAIDs. Check Cr next visit. Lab Results  Component Value Date   CREATININE 2.16* 07/05/2012

## 2012-07-21 DIAGNOSIS — J449 Chronic obstructive pulmonary disease, unspecified: Secondary | ICD-10-CM

## 2012-07-21 DIAGNOSIS — N183 Chronic kidney disease, stage 3 (moderate): Secondary | ICD-10-CM

## 2012-07-21 DIAGNOSIS — I129 Hypertensive chronic kidney disease with stage 1 through stage 4 chronic kidney disease, or unspecified chronic kidney disease: Secondary | ICD-10-CM

## 2012-07-21 DIAGNOSIS — R279 Unspecified lack of coordination: Secondary | ICD-10-CM

## 2012-07-21 DIAGNOSIS — J189 Pneumonia, unspecified organism: Secondary | ICD-10-CM

## 2012-08-05 DIAGNOSIS — H9190 Unspecified hearing loss, unspecified ear: Secondary | ICD-10-CM

## 2012-08-05 DIAGNOSIS — J449 Chronic obstructive pulmonary disease, unspecified: Secondary | ICD-10-CM

## 2012-08-05 DIAGNOSIS — I10 Essential (primary) hypertension: Secondary | ICD-10-CM

## 2012-08-06 ENCOUNTER — Encounter: Payer: Self-pay | Admitting: Family Medicine

## 2012-08-06 ENCOUNTER — Ambulatory Visit (INDEPENDENT_AMBULATORY_CARE_PROVIDER_SITE_OTHER): Payer: Medicare Other | Admitting: Family Medicine

## 2012-08-06 VITALS — BP 152/78 | HR 68 | Temp 98.2°F | Wt 179.0 lb

## 2012-08-06 DIAGNOSIS — I10 Essential (primary) hypertension: Secondary | ICD-10-CM

## 2012-08-06 DIAGNOSIS — J4489 Other specified chronic obstructive pulmonary disease: Secondary | ICD-10-CM

## 2012-08-06 DIAGNOSIS — J449 Chronic obstructive pulmonary disease, unspecified: Secondary | ICD-10-CM

## 2012-08-06 NOTE — Assessment & Plan Note (Signed)
New dx as of hospitalization at beginning of 07/2012. Denies significant issue with dyspnea or wheeze, lungs clear today. Main complaint seems to be chest congestion - rec trial of guaifenesin 400mg  IR with fluid for this.  If not better or worsening sxs, consider spirometry or trial of LABA.

## 2012-08-06 NOTE — Patient Instructions (Signed)
Check your blood pressure at home once a week - if staying >140/90 call me for med change. Start over the counter medicine - guaifenesin immediate release 400 mg with plenty of water to help mobilize mucous. Let me know if this doesn't help, would consider breathing test.

## 2012-08-06 NOTE — Assessment & Plan Note (Signed)
Chronic. Noted elevated today - recommend check at home and update me if consistently >140/90, for likely increase in amlodipine. Pt/daughter agree with plan. Lab Results  Component Value Date   CREATININE 2.16* 07/05/2012

## 2012-08-06 NOTE — Progress Notes (Signed)
  Subjective:    Patient ID: Ronnie Brown, male    DOB: October 11, 1922, 79 y.o.   MRN: [id number]  HPI : 1 mo f/u  Presents with daughter.  Seen at beginning of month for hosp f/u for COPD exac and superimposed PNA treated with steroids, levaquin, and neb treatment.  Feels breathing well.  "back to normal".  Not using inhaler regularly.  Denies significant trouble with SOB, wheezing.  No fevrs, no more coughing. HH no longer coming home.  PT did recommend cane   HTN - No HA, vision changes, CP/tightness, SOB, leg swelling.  Mild dizziness.  Compliant with bp meds.  Wt Readings from Last 3 Encounters:  08/06/12 179 lb (81.194 kg)  07/15/12 175 lb (79.379 kg)  07/05/12 178 lb 9.2 oz (81 kg)    BP Readings from Last 3 Encounters:  08/06/12 152/78  07/15/12 120/60  07/05/12 113/46   Past Medical History  Diagnosis Date  . Hypertension   . BPH (benign prostatic hyperplasia)     s/p TURP 2006  . Solitary lung nodule 03/2011    7mm R lung base, stable on rpt CT 10/2011, consider rpt 2 yrs  . Kidney stone 03/2011    obstructive R upper ureteral stone s/p stent placement/ureteral dilatation, stent removal, likely passed.  remaining 3 mm R lower pole stone asxs  . Hearing impairment     bilateral severe sensori-neural, hearing aids, last audiology eval 06/2011  ([facility])  . CKD (chronic kidney disease) stage 3, GFR 30-59 ml/min 01/27/2007  . COPD (chronic obstructive pulmonary disease)     by xray    Review of Systems Per HPI    Objective:   Physical Exam  Nursing note and vitals reviewed. Constitutional: He appears well-developed and well-nourished. No distress.  HENT:  Head: Normocephalic and atraumatic.  Cardiovascular: Normal rate, regular rhythm, normal heart sounds and intact distal pulses.   No murmur heard. Pulmonary/Chest: Effort normal and breath sounds normal. No respiratory distress. He has no wheezes. He has no rales.  Musculoskeletal: He exhibits edema (tr pedal  edema).       Assessment & Plan:

## 2012-09-23 ENCOUNTER — Other Ambulatory Visit: Payer: Self-pay | Admitting: *Deleted

## 2012-09-23 DIAGNOSIS — M7989 Other specified soft tissue disorders: Secondary | ICD-10-CM

## 2012-09-23 MED ORDER — HYDROCHLOROTHIAZIDE 25 MG PO TABS: 25.0000 mg | ORAL_TABLET | ORAL | Status: DC

## 2012-09-23 MED ORDER — LISINOPRIL 20 MG PO TABS: ORAL_TABLET | ORAL | Status: DC

## 2012-09-28 ENCOUNTER — Other Ambulatory Visit: Payer: Self-pay | Admitting: Family Medicine

## 2012-11-03 ENCOUNTER — Other Ambulatory Visit: Payer: Self-pay | Admitting: Family Medicine

## 2012-12-13 ENCOUNTER — Ambulatory Visit (INDEPENDENT_AMBULATORY_CARE_PROVIDER_SITE_OTHER): Payer: Medicare Other | Admitting: Family Medicine

## 2012-12-13 ENCOUNTER — Encounter: Payer: Self-pay | Admitting: Family Medicine

## 2012-12-13 VITALS — BP 148/74 | HR 72 | Temp 97.4°F | Wt 185.5 lb

## 2012-12-13 DIAGNOSIS — I10 Essential (primary) hypertension: Secondary | ICD-10-CM

## 2012-12-13 DIAGNOSIS — I129 Hypertensive chronic kidney disease with stage 1 through stage 4 chronic kidney disease, or unspecified chronic kidney disease: Secondary | ICD-10-CM

## 2012-12-13 DIAGNOSIS — N183 Chronic kidney disease, stage 3 unspecified: Secondary | ICD-10-CM

## 2012-12-13 DIAGNOSIS — H3530 Unspecified macular degeneration: Secondary | ICD-10-CM

## 2012-12-13 HISTORY — DX: Unspecified macular degeneration: H35.30

## 2012-12-13 NOTE — Progress Notes (Signed)
  Subjective:    Patient ID: Ronnie Brown, male    DOB: 05-15-1923, 79 y.o.   MRN: [id number]  HPI CC: 67mo f/u  HTN - brings log of blood pressures at home, 110-150s but averaging 130-140/50-70. No HA, vision changes, CP/tightness, SOB, leg swelling.   Seeing macular specialist - for macular degeneration.  On ARED vitamins ICAPS, feels helping.  Denies urinary sxs.  Past Medical History  Diagnosis Date  . Hypertension   . BPH (benign prostatic hyperplasia)     s/p TURP 2006  . Solitary lung nodule 03/2011    7mm R lung base, stable on rpt CT 10/2011, consider rpt 2 yrs  . Kidney stone 03/2011    obstructive R upper ureteral stone s/p stent placement/ureteral dilatation, stent removal, likely passed.  remaining 3 mm R lower pole stone asxs  . Hearing impairment     bilateral severe sensori-neural, hearing aids, last audiology eval 06/2011  ([facility])  . CKD (chronic kidney disease) stage 3, GFR 30-59 ml/min 01/27/2007  . COPD (chronic obstructive pulmonary disease)     by xray     Review of Systems Per HPI     Objective:   Physical Exam  Nursing note and vitals reviewed. Constitutional: He appears well-developed and well-nourished. No distress.  HENT:  Head: Normocephalic and atraumatic.  Mouth/Throat: Oropharynx is clear and moist. No oropharyngeal exudate.  Eyes: Conjunctivae and EOM are normal. Pupils are equal, round, and reactive to light. No scleral icterus.  Neck: Normal range of motion. Neck supple.  Cardiovascular: Normal rate, regular rhythm, normal heart sounds and intact distal pulses.   No murmur heard. Pulmonary/Chest: Effort normal and breath sounds normal. No respiratory distress. He has no wheezes. He has no rales.  Bibasilar crackles, clear with deep cough  Musculoskeletal: He exhibits edema (tr pitting edema bilaterally).       Assessment & Plan:

## 2012-12-13 NOTE — Assessment & Plan Note (Signed)
Overall stable as evidenced by log of bp he brings from home. Continue meds. Lab Results  Component Value Date   CREATININE 2.16* 07/05/2012

## 2012-12-13 NOTE — Patient Instructions (Signed)
Good to see you today, call us with questions. No changes today. Return in 3-6 months fasting for blood work and afterwards for [employer] vsiit

## 2012-12-13 NOTE — Assessment & Plan Note (Signed)
Encouraged good hydration 

## 2012-12-13 NOTE — Assessment & Plan Note (Signed)
Followed by macular specialist, started on MVI

## 2013-01-10 ENCOUNTER — Other Ambulatory Visit: Payer: Self-pay | Admitting: *Deleted

## 2013-01-10 MED ORDER — TAMSULOSIN HCL 0.4 MG PO CAPS: ORAL_CAPSULE | ORAL | Status: DC

## 2013-03-06 ENCOUNTER — Other Ambulatory Visit: Payer: Self-pay | Admitting: Family Medicine

## 2013-03-06 DIAGNOSIS — I10 Essential (primary) hypertension: Secondary | ICD-10-CM

## 2013-03-08 ENCOUNTER — Other Ambulatory Visit (INDEPENDENT_AMBULATORY_CARE_PROVIDER_SITE_OTHER): Payer: Medicare Other

## 2013-03-08 DIAGNOSIS — J449 Chronic obstructive pulmonary disease, unspecified: Secondary | ICD-10-CM

## 2013-03-08 DIAGNOSIS — I10 Essential (primary) hypertension: Secondary | ICD-10-CM

## 2013-03-08 DIAGNOSIS — Z0000 Encounter for general adult medical examination without abnormal findings: Secondary | ICD-10-CM

## 2013-03-08 LAB — COMPREHENSIVE METABOLIC PANEL
AST: 17 U/L (ref 0–37)
Albumin: 3.9 g/dL (ref 3.5–5.2)
BUN: 29 mg/dL — ABNORMAL HIGH (ref 6–23)
Calcium: 9.3 mg/dL (ref 8.4–10.5)
Chloride: 107 mEq/L (ref 96–112)
Potassium: 4.1 mEq/L (ref 3.5–5.1)

## 2013-03-08 LAB — LIPID PANEL
Cholesterol: 140 mg/dL (ref 0–200)
LDL Cholesterol: 81 mg/dL (ref 0–99)

## 2013-03-08 LAB — CBC WITH DIFFERENTIAL/PLATELET
Basophils Absolute: 0 10*3/uL (ref 0.0–0.1)
Basophils Relative: 0.6 % (ref 0.0–3.0)
Eosinophils Absolute: 0.3 10*3/uL (ref 0.0–0.7)
Lymphocytes Relative: 19.6 % (ref 12.0–46.0)
MCHC: 33.6 g/dL (ref 30.0–36.0)
Neutrophils Relative %: 66.9 % (ref 43.0–77.0)
RBC: 4.53 Mil/uL (ref 4.22–5.81)
RDW: 13.7 % (ref 11.5–14.6)

## 2013-03-15 ENCOUNTER — Encounter: Payer: Self-pay | Admitting: Family Medicine

## 2013-03-15 ENCOUNTER — Ambulatory Visit (INDEPENDENT_AMBULATORY_CARE_PROVIDER_SITE_OTHER): Payer: Medicare Other | Admitting: Family Medicine

## 2013-03-15 VITALS — BP 132/82 | HR 76 | Temp 97.5°F | Ht 70.0 in | Wt 187.0 lb

## 2013-03-15 DIAGNOSIS — J449 Chronic obstructive pulmonary disease, unspecified: Secondary | ICD-10-CM

## 2013-03-15 DIAGNOSIS — H9193 Unspecified hearing loss, bilateral: Secondary | ICD-10-CM

## 2013-03-15 DIAGNOSIS — M7989 Other specified soft tissue disorders: Secondary | ICD-10-CM

## 2013-03-15 DIAGNOSIS — H9190 Unspecified hearing loss, unspecified ear: Secondary | ICD-10-CM

## 2013-03-15 DIAGNOSIS — Z23 Encounter for immunization: Secondary | ICD-10-CM

## 2013-03-15 DIAGNOSIS — L989 Disorder of the skin and subcutaneous tissue, unspecified: Secondary | ICD-10-CM

## 2013-03-15 DIAGNOSIS — Z0000 Encounter for general adult medical examination without abnormal findings: Secondary | ICD-10-CM

## 2013-03-15 DIAGNOSIS — I10 Essential (primary) hypertension: Secondary | ICD-10-CM

## 2013-03-15 MED ORDER — HYDROCHLOROTHIAZIDE 25 MG PO TABS: 25.0000 mg | ORAL_TABLET | ORAL | Status: DC

## 2013-03-15 MED ORDER — TAMSULOSIN HCL 0.4 MG PO CAPS: ORAL_CAPSULE | ORAL | Status: DC

## 2013-03-15 MED ORDER — LISINOPRIL 20 MG PO TABS: ORAL_TABLET | ORAL | Status: DC

## 2013-03-15 MED ORDER — AMLODIPINE BESYLATE 5 MG PO TABS: ORAL_TABLET | ORAL | Status: DC

## 2013-03-15 NOTE — Assessment & Plan Note (Signed)
Refer to derm for eval of scalp lesions.

## 2013-03-15 NOTE — Progress Notes (Signed)
Subjective:    Patient ID: Ronnie Brown, male    DOB: 1922-12-10, 79 y.o.   MRN: [id number]  HPI CC: Medicare wellness exam  Spot on head - scaly.  Noticed for last 6 months. Does not use inhaler.  Hearing aides.  Sees yearly in december. (Dr. Sherral Hammers audiologist) Fails vision screen. Denies depression/anhedonia No falls in last year.  Does drive short distances.  Preventative:  Flu shot yearly  utd on tetanus, pna, shingles shots (2012)  Advanced directives: discussed - pt will discuss with family.   Medications and allergies reviewed and updated in chart.  Past histories reviewed and updated if relevant as below. Patient Active Problem List   Diagnosis Date Noted  . Macular degeneration 12/13/2012  . COPD (chronic obstructive pulmonary disease) 07/03/2012  . Leg swelling 02/13/2012  . Medicare annual wellness visit, subsequent 11/05/2011  . Hearing impairment   . BPH (benign prostatic hyperplasia)   . Solitary lung nodule 03/15/2011  . Kidney stone 03/15/2011  . CKD (chronic kidney disease) stage 3, GFR 30-59 ml/min 01/27/2007  . HYPERTENSION 01/22/2007   Past Medical History  Diagnosis Date  . Hypertension   . BPH (benign prostatic hyperplasia)     s/p TURP 2006  . Solitary lung nodule 03/2011    7mm R lung base, stable on rpt CT 10/2011, consider rpt 2 yrs  . Kidney stone 03/2011    obstructive R upper ureteral stone s/p stent placement/ureteral dilatation, stent removal, likely passed.  remaining 3 mm R lower pole stone asxs  . Hearing impairment     bilateral severe sensori-neural, hearing aids, last audiology eval 06/2011  ([facility])  . CKD (chronic kidney disease) stage 3, GFR 30-59 ml/min 01/27/2007  . COPD (chronic obstructive pulmonary disease)     by xray  . Macular degeneration 12/13/2012   Past Surgical History  Procedure Laterality Date  . Cystectomy  2002    right upper arm (Dr. Domenica Reamer)  . Cataract surgery  2001/03    Right 2001, Left laser  2003  . Patella fracture surgery  2003    Rehab  . Hospitalization  06/17-06/22/06    [facility], ? pneumonia, urinary retention, gastrocnemia, elevated PSA, ARI  . History of echo  12/29/04    EF 60%, no wall abnormality  . Transurethral resection of prostate  01/24/05    [facility]  . Cystoscopy  08/19/06    flexible cystoscopy nonobstrutive  . Hospitalization  03/2011    R obstructing ureteral stone s/p cystoscopy, ureteroscopy, R ureteral stent placement, ARF, CKD stage 3, found pulm nodule  . Hospitalization  06/2012    COPD exacerbation, PNA   History  Substance Use Topics  . Smoking status: Former Smoker -- 1.50 packs/day for 30 years    Quit date: 07/14/1978  . Smokeless tobacco: Never Used     Comment: Quit 1974  . Alcohol Use: No   Family History  Problem Relation Age of Onset  . Heart disease Mother     heart trouble, arrhythmia  . Diabetes Mother   . Cancer Father     skin in mouth  . Heart disease Sister     CAD  . Diabetes Sister   . Heart disease Brother     A. Fib  . Heart disease Daughter     CAD  . Dementia Daughter   . Hypertension Brother   . Cancer Son     lymphoma  . Stroke Neg Hx    No Known  Allergies Current Outpatient Prescriptions on File Prior to Visit  Medication Sig Dispense Refill  . acetaminophen (TYLENOL) 500 MG tablet Take 500 mg by mouth as needed.        Marland Kitchen amLODipine (NORVASC) 5 MG tablet TAKE 1 TABLET BY MOUTH EVERY NIGHT AT BEDTIME.  30 tablet  11  . Cholecalciferol (VITAMIN D3) 2000 UNITS capsule Take 1,000 Units by mouth daily.       . hydrochlorothiazide (HYDRODIURIL) 25 MG tablet Take 1 tablet (25 mg total) by mouth every morning.  30 tablet  6  . lisinopril (PRINIVIL,ZESTRIL) 20 MG tablet Take one by mouth every am and 1/2 every pm  45 tablet  6  . Multiple Vitamins-Minerals (ICAPS AREDS FORMULA PO) Take 1 capsule by mouth 2 (two) times daily.      . tamsulosin (FLOMAX) 0.4 MG CAPS TAKE 1 CAPSULE BY MOUTH DAILY.  30 capsule  1  .  albuterol (PROVENTIL) (5 MG/ML) 0.5% nebulizer solution Take 0.5 mLs (2.5 mg total) by nebulization every 6 (six) hours as needed for wheezing or shortness of breath.  20 mL  0  . ipratropium (ATROVENT) 0.02 % nebulizer solution Take 2.5 mLs (0.5 mg total) by nebulization every 6 (six) hours as needed.  75 mL  0   No current facility-administered medications on file prior to visit.     Review of Systems  Constitutional: Negative for fever, chills, activity change, appetite change, fatigue and unexpected weight change.  HENT: Negative for hearing loss and neck pain.   Respiratory: Positive for cough (mild after meals). Negative for chest tightness, shortness of breath and wheezing.   Cardiovascular: Positive for leg swelling (minimal). Negative for chest pain and palpitations.  Gastrointestinal: Negative for nausea, vomiting, abdominal pain, diarrhea, constipation, blood in stool and abdominal distention.  Genitourinary: Negative for urgency, hematuria and difficulty urinating.  Musculoskeletal: Negative for myalgias and arthralgias.  Skin: Negative for rash.  Neurological: Negative for dizziness, seizures, syncope and headaches.  Hematological: Negative for adenopathy. Does not bruise/bleed easily.  Psychiatric/Behavioral: Negative for dysphoric mood. The patient is not nervous/anxious.        Objective:   Physical Exam  Nursing note and vitals reviewed. Constitutional: He is oriented to person, place, and time. He appears well-developed and well-nourished. No distress.  HENT:  Head: Normocephalic and atraumatic.  Right Ear: External ear normal. Decreased hearing is noted.  Left Ear: External ear normal. Decreased hearing is noted.  Nose: Nose normal.  Mouth/Throat: Oropharynx is clear and moist. No oropharyngeal exudate.  hearing aides in place  Eyes: Conjunctivae and EOM are normal. Pupils are equal, round, and reactive to light. No scleral icterus.  Neck: Normal range of motion.  Neck supple. Carotid bruit is not present. No thyromegaly present.  Cardiovascular: Normal rate, normal heart sounds and intact distal pulses.  An irregular rhythm present.  No murmur heard. Pulses:      Radial pulses are 2+ on the right side, and 2+ on the left side.  Pulmonary/Chest: Effort normal and breath sounds normal. No respiratory distress. He has no wheezes. He has no rales.  Bibasilar crackles, some clearing with cough  Abdominal: Soft. Bowel sounds are normal. He exhibits no distension and no mass. There is no tenderness. There is no rebound and no guarding.  Musculoskeletal: Normal range of motion. He exhibits edema (tr ).  Lymphadenopathy:    He has no cervical adenopathy.  Neurological: He is alert and oriented to person, place, and time.  CN  grossly intact, station and gait intact  Skin: Skin is warm and dry. No rash noted.  Hyperkeratotic scaly lesions on scalp, largest anteriorly  Psychiatric: He has a normal mood and affect. His behavior is normal. Judgment and thought content normal.       Assessment & Plan:

## 2013-03-15 NOTE — Assessment & Plan Note (Signed)
Chronic, stable. Continue meds. 

## 2013-03-15 NOTE — Assessment & Plan Note (Signed)
I have personally reviewed the Medicare Annual Wellness questionnaire and have noted 1. The patient's medical and social history 2. Their use of alcohol, tobacco or illicit drugs 3. Their current medications and supplements 4. The patient's functional ability including ADL's, fall risks, home safety risks and hearing or visual impairment. 5. Diet and physical activity 6. Evidence for depression or mood disorders The patients weight, height, BMI have been recorded in the chart.  Hearing and vision has been addressed. I have made referrals, counseling and provided education to the patient based review of the above and I have provided the pt with a written personalized care plan for preventive services. See scanned questionairre. Advanced directives discussed: packet provided to review with family.  Reviewed preventative protocols and updated unless pt declined. Aged out of colon and prostate cancer screening. Advised to not drive until has hearing re evaluated - significantly hard of hearing today.  Presents with daughter today.

## 2013-03-15 NOTE — Assessment & Plan Note (Signed)
Severe.  Recommended reschedule ear evaluation.

## 2013-03-15 NOTE — Assessment & Plan Note (Signed)
Continue to monitor.  Does not regularly use any inhalers.

## 2013-03-15 NOTE — Patient Instructions (Addendum)
I may hold off on driving, at least until your hearing aides work better. Schedule skin doctor appointment - pass by [facility]'s office to see Dr. Terri Piedra. Good to see you today, call us with questions. Advanced directive packet provided. Flu shot today.

## 2013-03-15 NOTE — Assessment & Plan Note (Signed)
Chronic, stable. Continue bp control. Pt has had some fluctuation of kidney function over last year, however latest check as good as he's had in a while.   Continue to monitor.

## 2013-03-15 NOTE — Addendum Note (Signed)
Addended by: Josph Macho A on: 03/15/2013 11:45 AM   Modules accepted: Orders

## 2013-04-20 ENCOUNTER — Other Ambulatory Visit: Payer: Self-pay | Admitting: Dermatology

## 2013-04-27 ENCOUNTER — Encounter: Payer: Self-pay | Admitting: Family Medicine

## 2014-03-06 ENCOUNTER — Other Ambulatory Visit: Payer: Self-pay | Admitting: Family Medicine

## 2014-03-06 DIAGNOSIS — I10 Essential (primary) hypertension: Secondary | ICD-10-CM

## 2014-03-06 DIAGNOSIS — N183 Chronic kidney disease, stage 3 unspecified: Secondary | ICD-10-CM

## 2014-03-08 ENCOUNTER — Other Ambulatory Visit (INDEPENDENT_AMBULATORY_CARE_PROVIDER_SITE_OTHER): Payer: Medicare Other

## 2014-03-08 DIAGNOSIS — N183 Chronic kidney disease, stage 3 unspecified: Secondary | ICD-10-CM

## 2014-03-08 DIAGNOSIS — I10 Essential (primary) hypertension: Secondary | ICD-10-CM

## 2014-03-08 LAB — RENAL FUNCTION PANEL
Albumin: 3.7 g/dL (ref 3.5–5.2)
BUN: 30 mg/dL — ABNORMAL HIGH (ref 6–23)
CO2: 26 mEq/L (ref 19–32)
CREATININE: 1.6 mg/dL — AB (ref 0.4–1.5)
Calcium: 9.3 mg/dL (ref 8.4–10.5)
Chloride: 107 mEq/L (ref 96–112)
GFR: 43.47 mL/min — AB (ref >60.00)
GLUCOSE: 96 mg/dL (ref 70–99)
PHOSPHORUS: 2 mg/dL — AB (ref 2.3–4.6)
POTASSIUM: 4.4 meq/L (ref 3.5–5.1)
SODIUM: 142 meq/L (ref 135–145)

## 2014-03-08 LAB — LIPID PANEL
CHOL/HDL RATIO: 4
CHOLESTEROL: 124 mg/dL (ref 0–200)
HDL: 33.9 mg/dL — ABNORMAL LOW (ref >39.00)
LDL CALC: 72 mg/dL (ref 0–99)
NONHDL: 90.1
Triglycerides: 93 mg/dL (ref 0.0–149.0)
VLDL: 18.6 mg/dL (ref 0.0–40.0)

## 2014-03-09 ENCOUNTER — Other Ambulatory Visit: Payer: Medicare Other

## 2014-03-16 ENCOUNTER — Ambulatory Visit (INDEPENDENT_AMBULATORY_CARE_PROVIDER_SITE_OTHER): Payer: Medicare Other | Admitting: Family Medicine

## 2014-03-16 ENCOUNTER — Encounter: Payer: Self-pay | Admitting: Family Medicine

## 2014-03-16 VITALS — BP 142/76 | HR 76 | Temp 97.8°F | Ht 70.0 in | Wt 185.5 lb

## 2014-03-16 DIAGNOSIS — Z7189 Other specified counseling: Secondary | ICD-10-CM

## 2014-03-16 DIAGNOSIS — N183 Chronic kidney disease, stage 3 unspecified: Secondary | ICD-10-CM

## 2014-03-16 DIAGNOSIS — I10 Essential (primary) hypertension: Secondary | ICD-10-CM

## 2014-03-16 DIAGNOSIS — Z0000 Encounter for general adult medical examination without abnormal findings: Secondary | ICD-10-CM

## 2014-03-16 DIAGNOSIS — Z23 Encounter for immunization: Secondary | ICD-10-CM

## 2014-03-16 MED ORDER — LISINOPRIL 20 MG PO TABS: ORAL_TABLET | ORAL | Status: DC

## 2014-03-16 MED ORDER — AMLODIPINE BESYLATE 5 MG PO TABS: ORAL_TABLET | ORAL | Status: DC

## 2014-03-16 MED ORDER — HYDROCHLOROTHIAZIDE 25 MG PO TABS: 25.0000 mg | ORAL_TABLET | ORAL | Status: DC

## 2014-03-16 MED ORDER — TAMSULOSIN HCL 0.4 MG PO CAPS: ORAL_CAPSULE | ORAL | Status: DC

## 2014-03-16 NOTE — Patient Instructions (Signed)
Good to see you today Flu shot and prevnar today. Return as needed or in 1 year for next physical.

## 2014-03-16 NOTE — Assessment & Plan Note (Signed)
Preventative protocols reviewed and updated unless pt declined. Discussed healthy diet and lifestyle.  

## 2014-03-16 NOTE — Assessment & Plan Note (Signed)
Chronic, stable. Continue regimen. 

## 2014-03-16 NOTE — Progress Notes (Signed)
Pre visit review using our clinic review tool, if applicable. No additional management support is needed unless otherwise documented below in the visit note. 

## 2014-03-16 NOTE — Assessment & Plan Note (Signed)
Severe despite hearing aides.

## 2014-03-16 NOTE — Assessment & Plan Note (Signed)
I have personally reviewed the Medicare Annual Wellness questionnaire and have noted 1. The patient's medical and social history 2. Their use of alcohol, tobacco or illicit drugs 3. Their current medications and supplements 4. The patient's functional ability including ADL's, fall risks, home safety risks and hearing or visual impairment. 5. Diet and physical activity 6. Evidence for depression or mood disorders The patients weight, height, BMI have been recorded in the chart.  Hearing and vision has been addressed. I have made referrals, counseling and provided education to the patient based review of the above and I have provided the pt with a written personalized care plan for preventive services. Provider list updated - see scanned questionairre. Advanced directives discussed: daughter would be HCPOA.   Reviewed preventative protocols and updated unless pt declined.

## 2014-03-16 NOTE — Assessment & Plan Note (Signed)
Stable. Continue regimen. 

## 2014-03-16 NOTE — Assessment & Plan Note (Signed)
Advanced directives discussed: daughter would be HCPOA.

## 2014-03-16 NOTE — Addendum Note (Signed)
Addended by: Royann Shivers A on: 03/16/2014 01:05 PM   Modules accepted: Orders

## 2014-03-16 NOTE — Progress Notes (Signed)
BP 142/76  Pulse 76  Temp(Src) 97.8 F (36.6 C) (Oral)  Ht 5\' 10"  (1.778 m)  Wt 185 lb 8 oz (84.142 kg)  BMI 26.62 kg/m2   CC: medicare wellness  Subjective:    Patient ID: Ronnie Brown, male    DOB: 1922/08/05, 80 y.o.   MRN: [id number]  HPI: Ronnie Brown is a 80 y.o. male presenting on 03/16/2014 for Annual Exam   Presents with daughter  Hearing aides. Sees Dr. Kelby Fam audiologist yearly in december.  Fails vision screen. Does drive short distances.  Denies depression/anhedonia  No falls in last year.   Preventative: Flu shot yearly Pneumovax 2012, prevnar today utd on tetanus, shingles shots (2012)  Advanced directives: daughter would be HCProxy. Does not want prolongation of life if burdens someone else.   Lives alone. Family nearby. Married, wife lives in [place].. 12/03 (smoking, falling) Children - 5- son dec. 12/08 Activity: walking some. Diet: good water, fruits/vegetables daily  Relevant past medical, surgical, family and social history reviewed and updated as indicated.  Allergies and medications reviewed and updated. Current Outpatient Prescriptions on File Prior to Visit  Medication Sig  . acetaminophen (TYLENOL) 500 MG tablet Take 500 mg by mouth as needed.    . Multiple Vitamins-Minerals (ICAPS AREDS FORMULA PO) Take 1 capsule by mouth 2 (two) times daily.  . Cholecalciferol (VITAMIN D3) 2000 UNITS capsule Take 1,000 Units by mouth daily.    No current facility-administered medications on file prior to visit.    Review of Systems  Constitutional: Negative for fever, chills, activity change, appetite change, fatigue and unexpected weight change.  HENT: Negative for hearing loss.   Eyes: Negative for visual disturbance.  Respiratory: Positive for cough (mildly productive) and shortness of breath. Negative for chest tightness and wheezing.   Cardiovascular: Negative for chest pain, palpitations and leg swelling.  Gastrointestinal: Negative for nausea,  vomiting, abdominal pain, diarrhea, constipation, blood in stool and abdominal distention.  Genitourinary: Negative for hematuria and difficulty urinating.  Musculoskeletal: Negative for arthralgias, myalgias and neck pain.  Skin: Negative for rash.  Neurological: Negative for dizziness, seizures, syncope and headaches.  Hematological: Negative for adenopathy. Does not bruise/bleed easily.  Psychiatric/Behavioral: Negative for dysphoric mood. The patient is not nervous/anxious.    Per HPI unless specifically indicated above    Objective:    BP 142/76  Pulse 76  Temp(Src) 97.8 F (36.6 C) (Oral)  Ht 5\' 10"  (1.778 m)  Wt 185 lb 8 oz (84.142 kg)  BMI 26.62 kg/m2  Physical Exam  Nursing note and vitals reviewed. Constitutional: He is oriented to person, place, and time. He appears well-developed and well-nourished. No distress.  HENT:  Head: Normocephalic and atraumatic.  Right Ear: Tympanic membrane, external ear and ear canal normal. Decreased hearing is noted.  Left Ear: Tympanic membrane, external ear and ear canal normal. Decreased hearing is noted.  Nose: Nose normal.  Mouth/Throat: Uvula is midline, oropharynx is clear and moist and mucous membranes are normal. No oropharyngeal exudate, posterior oropharyngeal edema or posterior oropharyngeal erythema.  Hearing aides in place  Eyes: Conjunctivae and EOM are normal. Pupils are equal, round, and reactive to light. No scleral icterus.  Neck: Normal range of motion. Neck supple. Carotid bruit is not present. No thyromegaly present.  Cardiovascular: Normal rate, regular rhythm, normal heart sounds and intact distal pulses.   No murmur heard. Pulses:      Radial pulses are 2+ on the right side, and 2+ on  the left side.  Pulmonary/Chest: Effort normal and breath sounds normal. No respiratory distress. He has no wheezes. He has no rales.  Abdominal: Soft. Bowel sounds are normal. He exhibits no distension and no mass. There is no  tenderness. There is no rebound and no guarding.  Musculoskeletal: Normal range of motion. He exhibits no edema.  Lymphadenopathy:    He has no cervical adenopathy.  Neurological: He is alert and oriented to person, place, and time.  CN grossly intact, station and gait intact recall 2/3, still 2/3 with cues Calculation unable to spell world backwards  Skin: Skin is warm and dry. No rash noted.  Psychiatric: He has a normal mood and affect. His behavior is normal. Judgment and thought content normal.   Results for orders placed in visit on 03/08/14  LIPID PANEL      Result Value Ref Range   Cholesterol 124  0 - 200 mg/dL   Triglycerides 93.0  0.0 - 149.0 mg/dL   HDL 33.90 (*) >39.00 mg/dL   VLDL 18.6  0.0 - 40.0 mg/dL   LDL Cholesterol 72  0 - 99 mg/dL   Total CHOL/HDL Ratio 4     NonHDL 90.10    RENAL FUNCTION PANEL      Result Value Ref Range   Sodium 142  135 - 145 mEq/L   Potassium 4.4  3.5 - 5.1 mEq/L   Chloride 107  96 - 112 mEq/L   CO2 26  19 - 32 mEq/L   Calcium 9.3  8.4 - 10.5 mg/dL   Albumin 3.7  3.5 - 5.2 g/dL   BUN 30 (*) 6 - 23 mg/dL   Creatinine, Ser 1.6 (*) 0.4 - 1.5 mg/dL   Glucose, Bld 96  70 - 99 mg/dL   Phosphorus 2.0 (*) 2.3 - 4.6 mg/dL   GFR 43.47 (*) >60.00 mL/min      Assessment & Plan:   Problem List Items Addressed This Visit   Medicare annual wellness visit, subsequent - Primary     I have personally reviewed the Medicare Annual Wellness questionnaire and have noted 1. The patient's medical and social history 2. Their use of alcohol, tobacco or illicit drugs 3. Their current medications and supplements 4. The patient's functional ability including ADL's, fall risks, home safety risks and hearing or visual impairment. 5. Diet and physical activity 6. Evidence for depression or mood disorders The patients weight, height, BMI have been recorded in the chart.  Hearing and vision has been addressed. I have made referrals, counseling and provided  education to the patient based review of the above and I have provided the pt with a written personalized care plan for preventive services. Provider list updated - see scanned questionairre. Advanced directives discussed: daughter would be HCPOA.   Reviewed preventative protocols and updated unless pt declined.    HYPERTENSION     Chronic, stable. Continue regimen.    Relevant Medications      amLODIpine (NORVASC) tablet      hydrochlorothiazide tablet      lisinopril (PRINIVIL,ZESTRIL) tablet   Health maintenance examination     Preventative protocols reviewed and updated unless pt declined. Discussed healthy diet and lifestyle.    CKD (chronic kidney disease) stage 3, GFR 30-59 ml/min     Stable. Continue regimen.    Advanced care planning/counseling discussion     Advanced directives discussed: daughter would be HCPOA.         Follow up plan: Return as needed, for  medicare wellness.

## 2014-04-10 ENCOUNTER — Other Ambulatory Visit: Payer: Self-pay | Admitting: Family Medicine

## 2014-04-17 ENCOUNTER — Other Ambulatory Visit: Payer: Self-pay | Admitting: *Deleted

## 2014-04-17 MED ORDER — HYDROCHLOROTHIAZIDE 25 MG PO TABS: 25.0000 mg | ORAL_TABLET | ORAL | Status: DC

## 2014-07-14 HISTORY — PX: CATARACT EXTRACTION: SUR2

## 2014-10-31 DIAGNOSIS — H3531 Nonexudative age-related macular degeneration: Secondary | ICD-10-CM | POA: Diagnosis not present

## 2014-11-07 DIAGNOSIS — H2511 Age-related nuclear cataract, right eye: Secondary | ICD-10-CM | POA: Diagnosis not present

## 2014-11-16 DIAGNOSIS — H2181 Floppy iris syndrome: Secondary | ICD-10-CM | POA: Diagnosis not present

## 2014-11-16 DIAGNOSIS — H2511 Age-related nuclear cataract, right eye: Secondary | ICD-10-CM | POA: Diagnosis not present

## 2014-11-16 DIAGNOSIS — H25811 Combined forms of age-related cataract, right eye: Secondary | ICD-10-CM | POA: Diagnosis not present

## 2015-01-05 DIAGNOSIS — Z961 Presence of intraocular lens: Secondary | ICD-10-CM | POA: Diagnosis not present

## 2015-03-06 ENCOUNTER — Other Ambulatory Visit: Payer: Self-pay | Admitting: Family Medicine

## 2015-03-06 ENCOUNTER — Other Ambulatory Visit (INDEPENDENT_AMBULATORY_CARE_PROVIDER_SITE_OTHER): Payer: Medicare Other

## 2015-03-06 DIAGNOSIS — I10 Essential (primary) hypertension: Secondary | ICD-10-CM

## 2015-03-06 DIAGNOSIS — N183 Chronic kidney disease, stage 3 unspecified: Secondary | ICD-10-CM

## 2015-03-06 LAB — RENAL FUNCTION PANEL
ALBUMIN: 3.9 g/dL (ref 3.5–5.2)
BUN: 38 mg/dL — AB (ref 6–23)
CALCIUM: 9.5 mg/dL (ref 8.4–10.5)
CHLORIDE: 107 meq/L (ref 96–112)
CO2: 26 mEq/L (ref 19–32)
Creatinine, Ser: 1.85 mg/dL — ABNORMAL HIGH (ref 0.40–1.50)
GFR: 36.68 mL/min — ABNORMAL LOW (ref >60.00)
GLUCOSE: 100 mg/dL — AB (ref 70–99)
Phosphorus: 3 mg/dL (ref 2.3–4.6)
Potassium: 4.5 mEq/L (ref 3.5–5.1)
SODIUM: 140 meq/L (ref 135–145)

## 2015-03-06 LAB — CBC WITH DIFFERENTIAL/PLATELET
BASOS ABS: 0.1 10*3/uL (ref 0.0–0.1)
BASOS PCT: 0.6 % (ref 0.0–3.0)
EOS ABS: 0.3 10*3/uL (ref 0.0–0.7)
Eosinophils Relative: 3 % (ref 0.0–5.0)
HCT: 46.9 % (ref 39.0–52.0)
Hemoglobin: 15.4 g/dL (ref 13.0–17.0)
LYMPHS ABS: 1.6 10*3/uL (ref 0.7–4.0)
Lymphocytes Relative: 17.8 % (ref 12.0–46.0)
MCHC: 32.9 g/dL (ref 30.0–36.0)
MCV: 94.4 fl (ref 78.0–100.0)
MONO ABS: 0.6 10*3/uL (ref 0.1–1.0)
Monocytes Relative: 6.7 % (ref 3.0–12.0)
NEUTROS ABS: 6.4 10*3/uL (ref 1.4–7.7)
NEUTROS PCT: 71.9 % (ref 43.0–77.0)
PLATELETS: 208 10*3/uL (ref 150.0–400.0)
RBC: 4.97 Mil/uL (ref 4.22–5.81)
RDW: 13.9 % (ref 11.5–15.5)
WBC: 9 10*3/uL (ref 4.0–10.5)

## 2015-03-06 LAB — LIPID PANEL
CHOLESTEROL: 130 mg/dL (ref 0–200)
HDL: 34.3 mg/dL — ABNORMAL LOW (ref >39.00)
LDL CALC: 68 mg/dL (ref 0–99)
NONHDL: 95.71
Total CHOL/HDL Ratio: 4
Triglycerides: 138 mg/dL (ref 0.0–149.0)
VLDL: 27.6 mg/dL (ref 0.0–40.0)

## 2015-03-06 LAB — VITAMIN D 25 HYDROXY (VIT D DEFICIENCY, FRACTURES): VITD: 52 ng/mL (ref 30.00–100.00)

## 2015-03-06 LAB — TSH: TSH: 1.33 u[IU]/mL (ref 0.35–4.50)

## 2015-03-07 LAB — PARATHYROID HORMONE, INTACT (NO CA): PTH: 42 pg/mL (ref 14–64)

## 2015-03-13 ENCOUNTER — Other Ambulatory Visit: Payer: Medicare Other

## 2015-03-20 ENCOUNTER — Ambulatory Visit (INDEPENDENT_AMBULATORY_CARE_PROVIDER_SITE_OTHER): Payer: Medicare Other | Admitting: Family Medicine

## 2015-03-20 ENCOUNTER — Encounter: Payer: Self-pay | Admitting: Family Medicine

## 2015-03-20 VITALS — BP 112/60 | HR 76 | Temp 98.1°F | Ht 70.0 in | Wt 179.8 lb

## 2015-03-20 DIAGNOSIS — N183 Chronic kidney disease, stage 3 unspecified: Secondary | ICD-10-CM

## 2015-03-20 DIAGNOSIS — H9193 Unspecified hearing loss, bilateral: Secondary | ICD-10-CM

## 2015-03-20 DIAGNOSIS — Z0000 Encounter for general adult medical examination without abnormal findings: Secondary | ICD-10-CM | POA: Diagnosis not present

## 2015-03-20 DIAGNOSIS — Z23 Encounter for immunization: Secondary | ICD-10-CM | POA: Diagnosis not present

## 2015-03-20 DIAGNOSIS — I10 Essential (primary) hypertension: Secondary | ICD-10-CM

## 2015-03-20 DIAGNOSIS — J449 Chronic obstructive pulmonary disease, unspecified: Secondary | ICD-10-CM

## 2015-03-20 DIAGNOSIS — N400 Enlarged prostate without lower urinary tract symptoms: Secondary | ICD-10-CM

## 2015-03-20 DIAGNOSIS — Z7189 Other specified counseling: Secondary | ICD-10-CM

## 2015-03-20 MED ORDER — LISINOPRIL 20 MG PO TABS: 20.0000 mg | ORAL_TABLET | Freq: Two times a day (BID) | ORAL | Status: DC

## 2015-03-20 MED ORDER — HYDROCHLOROTHIAZIDE 25 MG PO TABS: 25.0000 mg | ORAL_TABLET | ORAL | Status: DC

## 2015-03-20 NOTE — Assessment & Plan Note (Signed)
Preventative protocols reviewed and updated unless pt declined. Discussed healthy diet and lifestyle.  

## 2015-03-20 NOTE — Progress Notes (Signed)
Pre visit review using our clinic review tool, if applicable. No additional management support is needed unless otherwise documented below in the visit note. 

## 2015-03-20 NOTE — Assessment & Plan Note (Signed)
Advanced directives: daughter would be HCProxy. Does not want prolongation of life if burdens someone else. Does not want to go to nursing home.

## 2015-03-20 NOTE — Progress Notes (Signed)
BP 112/60 mmHg  Pulse 76  Temp(Src) 98.1 F (36.7 C) (Oral)  Ht 5\' 10"  (1.778 m)  Wt 179 lb 12 oz (81.534 kg)  BMI 25.79 kg/m2   CC: medicare wellness visit  Subjective:    Patient ID: Ronnie Brown, male    DOB: Apr 06, 1923, 81 y.o.   MRN: [id number]  HPI: Ronnie Brown is a 81 y.o. male presenting on 03/20/2015 for Annual Exam   Presents with Daughter who is POA. Just celebrated 90th birthday!   HTN - doesn't check at home.  BP Readings from Last 3 Encounters:  03/20/15 112/60  03/16/14 142/76  03/15/13 132/82    Endorses good appetite, normal BM, normal urination.  Hearing aides. Sees Dr. Kelby Fam audiologist yearly in december.  Vision screen - sees eye clinic yearly. Does drive short distances.  Denies depression/anhedonia  No falls in last year.   Preventative: Flu shot yearly  Pneumovax 2012, prevnar 2015  utd on tetanus, shingles shots (2012)  Advanced directives: daughter would be HCProxy. Does not want prolongation of life if burdens someone else. Does not want to go to nursing home.   Lives alone. Family nearby. Youngest brother and SIL may be moving in with patient later 2016 Married, wife lives in nursing home 12/03 (smoking, falling) Children - 5- son dec. 12/08 Activity: walking some. Diet: good water, fruits/vegetables daily  Relevant past medical, surgical, family and social history reviewed and updated as indicated. Interim medical history since our last visit reviewed. Allergies and medications reviewed and updated. Current Outpatient Prescriptions on File Prior to Visit  Medication Sig  . acetaminophen (TYLENOL) 500 MG tablet Take 500 mg by mouth as needed.    . Multiple Vitamins-Minerals (ICAPS AREDS FORMULA PO) Take 1 capsule by mouth 2 (two) times daily.  . tamsulosin (FLOMAX) 0.4 MG CAPS capsule TAKE 1 CAPSULE BY MOUTH DAILY.  Marland Kitchen Cholecalciferol (VITAMIN D3) 2000 UNITS capsule Take 1,000 Units by mouth daily.    No current  facility-administered medications on file prior to visit.    Review of Systems  Constitutional: Negative for fever, chills, activity change, appetite change, fatigue and unexpected weight change.  HENT: Negative for hearing loss.   Eyes: Negative for visual disturbance.  Respiratory: Negative for cough, chest tightness, shortness of breath and wheezing.   Cardiovascular: Negative for chest pain, palpitations and leg swelling.  Gastrointestinal: Negative for nausea, vomiting, abdominal pain, diarrhea, constipation, blood in stool and abdominal distention.  Genitourinary: Negative for hematuria and difficulty urinating.  Musculoskeletal: Negative for myalgias, arthralgias and neck pain.  Skin: Negative for rash.  Neurological: Positive for dizziness (when first standing up). Negative for seizures, syncope and headaches.  Hematological: Negative for adenopathy. Does not bruise/bleed easily.  Psychiatric/Behavioral: Negative for dysphoric mood. The patient is not nervous/anxious.    Per HPI unless specifically indicated above     Objective:    BP 112/60 mmHg  Pulse 76  Temp(Src) 98.1 F (36.7 C) (Oral)  Ht 5\' 10"  (1.778 m)  Wt 179 lb 12 oz (81.534 kg)  BMI 25.79 kg/m2  Wt Readings from Last 3 Encounters:  03/20/15 179 lb 12 oz (81.534 kg)  03/16/14 185 lb 8 oz (84.142 kg)  03/15/13 187 lb (84.823 kg)    Physical Exam  Constitutional: He is oriented to person, place, and time. He appears well-developed and well-nourished. No distress.  HENT:  Head: Normocephalic and atraumatic.  Right Ear: Hearing, tympanic membrane, external ear and ear canal normal.  Left  Ear: Hearing, tympanic membrane, external ear and ear canal normal.  Nose: Nose normal.  Mouth/Throat: Uvula is midline, oropharynx is clear and moist and mucous membranes are normal. No oropharyngeal exudate, posterior oropharyngeal edema or posterior oropharyngeal erythema.  Eyes: Conjunctivae and EOM are normal. Pupils are  equal, round, and reactive to light. No scleral icterus.  Neck: Normal range of motion. Neck supple. Carotid bruit is not present. No thyromegaly present.  Cardiovascular: Normal rate, regular rhythm, normal heart sounds and intact distal pulses.   No murmur heard. Pulses:      Radial pulses are 2+ on the right side, and 2+ on the left side.  Pulmonary/Chest: Effort normal and breath sounds normal. No respiratory distress. He has no wheezes. He has no rales.  Abdominal: Soft. Bowel sounds are normal. He exhibits no distension and no mass. There is no tenderness. There is no rebound and no guarding.  Musculoskeletal: Normal range of motion. He exhibits no edema.  Lymphadenopathy:    He has no cervical adenopathy.  Neurological: He is alert and oriented to person, place, and time.  CN grossly intact, station and gait intact  Skin: Skin is warm and dry. No rash noted.  Psychiatric: He has a normal mood and affect. His behavior is normal. Judgment and thought content normal.  Nursing note and vitals reviewed.  Results for orders placed or performed in visit on 03/06/15  CBC with Differential/Platelet  Result Value Ref Range   WBC 9.0 4.0 - 10.5 K/uL   RBC 4.97 4.22 - 5.81 Mil/uL   Hemoglobin 15.4 13.0 - 17.0 g/dL   HCT 46.9 39.0 - 52.0 %   MCV 94.4 78.0 - 100.0 fl   MCHC 32.9 30.0 - 36.0 g/dL   RDW 13.9 11.5 - 15.5 %   Platelets 208.0 150.0 - 400.0 K/uL   Neutrophils Relative % 71.9 43.0 - 77.0 %   Lymphocytes Relative 17.8 12.0 - 46.0 %   Monocytes Relative 6.7 3.0 - 12.0 %   Eosinophils Relative 3.0 0.0 - 5.0 %   Basophils Relative 0.6 0.0 - 3.0 %   Neutro Abs 6.4 1.4 - 7.7 K/uL   Lymphs Abs 1.6 0.7 - 4.0 K/uL   Monocytes Absolute 0.6 0.1 - 1.0 K/uL   Eosinophils Absolute 0.3 0.0 - 0.7 K/uL   Basophils Absolute 0.1 0.0 - 0.1 K/uL  TSH  Result Value Ref Range   TSH 1.33 0.35 - 4.50 uIU/mL  Parathyroid hormone, intact (no Ca)  Result Value Ref Range   PTH 42 14 - 64 pg/mL    Renal function panel  Result Value Ref Range   Sodium 140 135 - 145 mEq/L   Potassium 4.5 3.5 - 5.1 mEq/L   Chloride 107 96 - 112 mEq/L   CO2 26 19 - 32 mEq/L   Calcium 9.5 8.4 - 10.5 mg/dL   Albumin 3.9 3.5 - 5.2 g/dL   BUN 38 (H) 6 - 23 mg/dL   Creatinine, Ser 1.85 (H) 0.40 - 1.50 mg/dL   Glucose, Bld 100 (H) 70 - 99 mg/dL   Phosphorus 3.0 2.3 - 4.6 mg/dL   GFR 36.68 (L) >60.00 mL/min  Lipid panel  Result Value Ref Range   Cholesterol 130 0 - 200 mg/dL   Triglycerides 138.0 0.0 - 149.0 mg/dL   HDL 34.30 (L) >39.00 mg/dL   VLDL 27.6 0.0 - 40.0 mg/dL   LDL Cholesterol 68 0 - 99 mg/dL   Total CHOL/HDL Ratio 4    NonHDL  95.71   Vit D  25 hydroxy (rtn osteoporosis monitoring)  Result Value Ref Range   VITD 52.00 30.00 - 100.00 ng/mL      Assessment & Plan:   Problem List Items Addressed This Visit    Essential hypertension    Pt has been off amlodipine and had self increased lisinopril to 20mg  bid. Will continue this along with hctz 25mg  daily. bp under great control today. Advised monitor at home and let me know if consistently high or low.      Relevant Medications   lisinopril (PRINIVIL,ZESTRIL) 20 MG tablet   hydrochlorothiazide (HYDRODIURIL) 25 MG tablet   CKD (chronic kidney disease) stage 3, GFR 30-59 ml/min    Again reviewed with patient and daughter. Encouraged good hydration status and avoidance of NSAIDs.       Hearing impairment    Hearing aides with mild improvement noted.      BPH (benign prostatic hyperplasia)    Chronic, stable. Continue flomax. We have stopped testing PSAs.       Medicare annual wellness visit, subsequent - Primary    I have personally reviewed the Medicare Annual Wellness questionnaire and have noted 1. The patient's medical and social history 2. Their use of alcohol, tobacco or illicit drugs 3. Their current medications and supplements 4. The patient's functional ability including ADL's, fall risks, home safety risks and  hearing or visual impairment. Cognitive function has been assessed and addressed as indicated.  5. Diet and physical activity 6. Evidence for depression or mood disorders The patients weight, height, BMI have been recorded in the chart. I have made referrals, counseling and provided education to the patient based on review of the above and I have provided the pt with a written personalized care plan for preventive services. Provider list updated.. See scanned questionairre as needed for further documentation. Reviewed preventative protocols and updated unless pt declined.       COPD (chronic obstructive pulmonary disease)    Stable off meds.      Health maintenance examination    Preventative protocols reviewed and updated unless pt declined. Discussed healthy diet and lifestyle.       Advanced care planning/counseling discussion    Advanced directives: daughter would be HCProxy. Does not want prolongation of life if burdens someone else. Does not want to go to nursing home.           Follow up plan: Return in about 1 year (around 03/19/2016), or as needed, for annual exam, prior fasting for blood work.

## 2015-03-20 NOTE — Patient Instructions (Addendum)
Advanced directive packet provided today. Flu shot today Let's stop amlodipine . Continue lisinopril 20mg  twice daily and hydrochlorothiazide 25mg  daily. Keep an eye on blood pressures a few times a week if you can and let me know if consistently <110 or >140. Good to see you today, call us with questions. Return as needed or in 1 year for next medicare wellness visit.

## 2015-03-20 NOTE — Assessment & Plan Note (Signed)
Hearing aides with mild improvement noted.

## 2015-03-20 NOTE — Assessment & Plan Note (Addendum)
Pt has been off amlodipine and had self increased lisinopril to 20mg  bid. Will continue this along with hctz 25mg  daily. bp under great control today. Advised monitor at home and let me know if consistently high or low.

## 2015-03-20 NOTE — Assessment & Plan Note (Signed)
Again reviewed with patient and daughter. Encouraged good hydration status and avoidance of NSAIDs.

## 2015-03-20 NOTE — Assessment & Plan Note (Signed)

## 2015-03-20 NOTE — Addendum Note (Signed)
Addended by: Royann Shivers A on: 03/20/2015 04:48 PM   Modules accepted: Orders

## 2015-03-20 NOTE — Assessment & Plan Note (Signed)
Stable off meds. ?

## 2015-03-20 NOTE — Assessment & Plan Note (Signed)
Chronic, stable. Continue flomax. We have stopped testing PSAs.

## 2015-03-22 ENCOUNTER — Other Ambulatory Visit: Payer: Self-pay | Admitting: Family Medicine

## 2015-04-23 ENCOUNTER — Other Ambulatory Visit: Payer: Self-pay | Admitting: Family Medicine

## 2015-05-01 DIAGNOSIS — H353132 Nonexudative age-related macular degeneration, bilateral, intermediate dry stage: Secondary | ICD-10-CM | POA: Diagnosis not present

## 2015-05-01 DIAGNOSIS — H353112 Nonexudative age-related macular degeneration, right eye, intermediate dry stage: Secondary | ICD-10-CM | POA: Diagnosis not present

## 2015-05-09 ENCOUNTER — Other Ambulatory Visit: Payer: Self-pay | Admitting: Family Medicine

## 2015-05-15 ENCOUNTER — Telehealth: Payer: Self-pay

## 2015-05-15 NOTE — Telephone Encounter (Signed)
Diane said Cleona was telling pt only 2 meds(Tamsulosin and lisinopril) refillable at this time; spoke with [facility] and HCTZ has been ready for pick up since 05/09/15. Diane voiced understanding.

## 2015-06-27 DIAGNOSIS — H903 Sensorineural hearing loss, bilateral: Secondary | ICD-10-CM | POA: Diagnosis not present

## 2015-06-27 DIAGNOSIS — H6063 Unspecified chronic otitis externa, bilateral: Secondary | ICD-10-CM | POA: Diagnosis not present

## 2015-06-27 DIAGNOSIS — H6122 Impacted cerumen, left ear: Secondary | ICD-10-CM | POA: Diagnosis not present

## 2015-06-29 ENCOUNTER — Encounter: Payer: Self-pay | Admitting: Family Medicine

## 2015-07-11 DIAGNOSIS — H61012 Acute perichondritis of left external ear: Secondary | ICD-10-CM | POA: Diagnosis not present

## 2015-07-15 DIAGNOSIS — C44209 Unspecified malignant neoplasm of skin of left ear and external auricular canal: Secondary | ICD-10-CM

## 2015-07-15 HISTORY — DX: Unspecified malignant neoplasm of skin of left ear and external auricular canal: C44.209

## 2015-08-09 DIAGNOSIS — H61012 Acute perichondritis of left external ear: Secondary | ICD-10-CM | POA: Diagnosis not present

## 2015-08-15 HISTORY — PX: SKIN CANCER EXCISION: SHX779

## 2015-08-21 DIAGNOSIS — C44229 Squamous cell carcinoma of skin of left ear and external auricular canal: Secondary | ICD-10-CM | POA: Diagnosis not present

## 2015-08-21 DIAGNOSIS — H6192 Disorder of left external ear, unspecified: Secondary | ICD-10-CM | POA: Diagnosis not present

## 2015-08-25 ENCOUNTER — Encounter: Payer: Self-pay | Admitting: Family Medicine

## 2015-08-25 DIAGNOSIS — C44209 Unspecified malignant neoplasm of skin of left ear and external auricular canal: Secondary | ICD-10-CM | POA: Insufficient documentation

## 2015-09-11 ENCOUNTER — Other Ambulatory Visit: Payer: Self-pay | Admitting: Otolaryngology

## 2015-09-11 DIAGNOSIS — C44229 Squamous cell carcinoma of skin of left ear and external auricular canal: Secondary | ICD-10-CM | POA: Diagnosis not present

## 2015-09-13 ENCOUNTER — Encounter: Payer: Self-pay | Admitting: Family Medicine

## 2015-09-18 DIAGNOSIS — C4442 Squamous cell carcinoma of skin of scalp and neck: Secondary | ICD-10-CM | POA: Diagnosis not present

## 2015-10-30 DIAGNOSIS — H353132 Nonexudative age-related macular degeneration, bilateral, intermediate dry stage: Secondary | ICD-10-CM | POA: Diagnosis not present

## 2015-11-06 DIAGNOSIS — L821 Other seborrheic keratosis: Secondary | ICD-10-CM | POA: Diagnosis not present

## 2015-11-06 DIAGNOSIS — L570 Actinic keratosis: Secondary | ICD-10-CM | POA: Diagnosis not present

## 2015-11-06 DIAGNOSIS — D235 Other benign neoplasm of skin of trunk: Secondary | ICD-10-CM | POA: Diagnosis not present

## 2015-11-06 DIAGNOSIS — D1801 Hemangioma of skin and subcutaneous tissue: Secondary | ICD-10-CM | POA: Diagnosis not present

## 2015-11-06 DIAGNOSIS — L923 Foreign body granuloma of the skin and subcutaneous tissue: Secondary | ICD-10-CM | POA: Diagnosis not present

## 2015-11-06 DIAGNOSIS — Z85828 Personal history of other malignant neoplasm of skin: Secondary | ICD-10-CM | POA: Diagnosis not present

## 2015-11-08 ENCOUNTER — Telehealth: Payer: Self-pay | Admitting: Family Medicine

## 2015-11-08 NOTE — Telephone Encounter (Signed)
[place] Patient Name: ARMANY TORREZ DOB: 1923-06-17 Initial Comment caller states her father has a swelling under his eye Nurse Assessment Nurse: Dimas Chyle, RN, Dellis Filbert Date/Time Eilene Ghazi Time): 11/08/2015 9:43:09 AM Confirm and document reason for call. If symptomatic, describe symptoms. You must click the next button to save text entered. ---Caller states her father has a swelling under his eye. Symptoms started yesterday. Bruising. Has the patient traveled out of the country within the last 30 days? ---No Does the patient have any new or worsening symptoms? ---Yes Will a triage be completed? ---Yes Related visit to physician within the last 2 weeks? ---No Does the PT have any chronic conditions? (i.e. diabetes, asthma, etc.) ---Yes List chronic conditions. ---HTN Is this a behavioral health or substance abuse call? ---No Guidelines Guideline Title Affirmed Question Affirmed Notes Eye - Swelling Eyelid swelling from suspected mild irritant (all triage questions negative) Final Disposition User [place], RN, Dellis Filbert Disagree/Comply: Comply

## 2015-11-08 NOTE — Telephone Encounter (Signed)
PLEASE NOTE: All timestamps contained within this report are represented as [place] Standard Time. CONFIDENTIALTY NOTICE: This fax transmission is intended only for the addressee. It contains information that is legally privileged, confidential or otherwise protected from use or disclosure. If you are not the intended recipient, you are strictly prohibited from reviewing, disclosing, copying using or disseminating any of this information or taking any action in reliance on or regarding this information. If you have received this fax in error, please notify us immediately by telephone so that we can arrange for its return to [place]. Phone: [phone], Toll-Free: [phone], Fax: [phone] Page: 1 of 2 Call Id: [id number] [place] Patient Name: Ronnie Brown Gender: Male DOB: June 24, 1923 Age: 82 Y 6 M 11 D Return Phone Number: [phone] (Primary) Address: City/State/Zip: [place] [facility] - Client Client Site [place] - Day Physician Ria Bush Contact Type Call Who Is Calling Patient / Member / Family / Caregiver Call Type Triage / Clinical Caller Name DIane Relationship To Patient Daughter Return Phone Number [phone] (Primary) Chief Complaint Facial Swelling Reason for Call Symptomatic / Request for Health Information Initial Comment caller states her father has a swelling under his eye Appointment Disposition EMR Appointment Not Necessary Info pasted into Epic Yes PreDisposition Did not know what to do Translation No Nurse Assessment Nurse: Dimas Chyle, RN, Dellis Filbert Date/Time (Eastern Time): 11/08/2015 9:43:09 AM Confirm and document reason for call. If symptomatic, describe symptoms. You must click the next button to save text entered. ---Caller states her father has a swelling under his eye. Symptoms started yesterday.  Bruising. Has the patient traveled out of the country within the last 30 days? ---No Does the patient have any new or worsening symptoms? ---Yes Will a triage be completed? ---Yes Related visit to physician within the last 2 weeks? ---No Does the PT have any chronic conditions? (i.e. diabetes, asthma, etc.) ---Yes List chronic conditions. ---HTN Is this a behavioral health or substance abuse call? ---No Guidelines Guideline Title Affirmed Question Affirmed Notes Nurse Date/Time Eilene Ghazi Time) Eye - Swelling Eyelid swelling from suspected mild irritant (all triage questions negative) Dimas Chyle, RN, Dellis Filbert 11/08/2015 9:47:36 AM Disp. Time Eilene Ghazi Time) Disposition Final User 11/08/2015 9:49:41 AM Home Care Yes Dimas Chyle, RN, Dellis Filbert PLEASE NOTE: All timestamps contained within this report are represented as [place] Standard Time. CONFIDENTIALTY NOTICE: This fax transmission is intended only for the addressee. It contains information that is legally privileged, confidential or otherwise protected from use or disclosure. If you are not the intended recipient, you are strictly prohibited from reviewing, disclosing, copying using or disseminating any of this information or taking any action in reliance on or regarding this information. If you have received this fax in error, please notify us immediately by telephone so that we can arrange for its return to [place]. Phone: [phone], Toll-Free: [phone], Fax: [phone] Page: 2 of 2 Call Id: [id number] Caller Understands: Yes Disagree/Comply: Comply Care Advice Given Per Guideline HOME CARE: You should be able to treat this at home. REASSURANCE: It sounds like a mild reaction that we can treat at home. [facility] FACE: Wash the face, then the eyelids, with a mild soap and water. This will remove any allergic or irritating substance (e.g., hair dye, nail polish). LOCAL COLD: Apply a cool, wet washcloth to the area for 20 minutes. ANTIHISTAMINE  (E.G., BENADRYL): * Take an antihistamine by mouth to reduce  the swelling and to help with any itching. * Benadryl (diphenhydramine) every 6 hours is best. Adult dose is 25-50 mg. Take 2 or 3 times. * If Benadryl is not available, use any hay fever or cold medicine that contains an antihistamine. CARE ADVICE given per Eye - Swelling (Adult) guideline. * You become worse. * Swelling becomes red or painful to the touch * Swelling persists over 3 days CALL BACK IF: CAUTION - ANTIHISTAMINES: * Examples include diphenhydramine (Benadryl) and chlorpheniramine (Chlortrimeton, Chlor-tripolon) * May cause sleepiness. Do not drink alcohol, drive or operate dangerous machinery while taking antihistamines. * Do not take these medicines if you have prostate problems. * Read the package instructions and warnings.

## 2015-12-25 DIAGNOSIS — Z961 Presence of intraocular lens: Secondary | ICD-10-CM | POA: Diagnosis not present

## 2015-12-25 DIAGNOSIS — Z0100 Encounter for examination of eyes and vision without abnormal findings: Secondary | ICD-10-CM | POA: Diagnosis not present

## 2016-01-08 DIAGNOSIS — H903 Sensorineural hearing loss, bilateral: Secondary | ICD-10-CM | POA: Diagnosis not present

## 2016-01-08 DIAGNOSIS — H6122 Impacted cerumen, left ear: Secondary | ICD-10-CM | POA: Diagnosis not present

## 2016-03-19 ENCOUNTER — Other Ambulatory Visit: Payer: Self-pay | Admitting: Family Medicine

## 2016-03-19 DIAGNOSIS — N183 Chronic kidney disease, stage 3 unspecified: Secondary | ICD-10-CM

## 2016-03-19 DIAGNOSIS — I10 Essential (primary) hypertension: Secondary | ICD-10-CM

## 2016-03-20 ENCOUNTER — Other Ambulatory Visit (INDEPENDENT_AMBULATORY_CARE_PROVIDER_SITE_OTHER): Payer: Medicare Other

## 2016-03-20 DIAGNOSIS — N183 Chronic kidney disease, stage 3 unspecified: Secondary | ICD-10-CM

## 2016-03-20 LAB — CBC WITH DIFFERENTIAL/PLATELET
BASOS PCT: 0.5 % (ref 0.0–3.0)
Basophils Absolute: 0 10*3/uL (ref 0.0–0.1)
EOS ABS: 0.2 10*3/uL (ref 0.0–0.7)
EOS PCT: 2.4 % (ref 0.0–5.0)
HEMATOCRIT: 43.7 % (ref 39.0–52.0)
Hemoglobin: 14.8 g/dL (ref 13.0–17.0)
LYMPHS PCT: 14.2 % (ref 12.0–46.0)
Lymphs Abs: 1.2 10*3/uL (ref 0.7–4.0)
MCHC: 33.8 g/dL (ref 30.0–36.0)
MCV: 93.9 fl (ref 78.0–100.0)
Monocytes Absolute: 0.7 10*3/uL (ref 0.1–1.0)
Monocytes Relative: 8.2 % (ref 3.0–12.0)
NEUTROS ABS: 6.2 10*3/uL (ref 1.4–7.7)
Neutrophils Relative %: 74.7 % (ref 43.0–77.0)
PLATELETS: 220 10*3/uL (ref 150.0–400.0)
RBC: 4.66 Mil/uL (ref 4.22–5.81)
RDW: 13.8 % (ref 11.5–15.5)
WBC: 8.3 10*3/uL (ref 4.0–10.5)

## 2016-03-20 LAB — RENAL FUNCTION PANEL
Albumin: 3.9 g/dL (ref 3.5–5.2)
BUN: 53 mg/dL — ABNORMAL HIGH (ref 6–23)
CALCIUM: 9.2 mg/dL (ref 8.4–10.5)
CO2: 29 meq/L (ref 19–32)
CREATININE: 2.01 mg/dL — AB (ref 0.40–1.50)
Chloride: 108 mEq/L (ref 96–112)
GFR: 33.26 mL/min — AB (ref >60.00)
Glucose, Bld: 116 mg/dL — ABNORMAL HIGH (ref 70–99)
Phosphorus: 2.8 mg/dL (ref 2.3–4.6)
Potassium: 4.9 mEq/L (ref 3.5–5.1)
Sodium: 142 mEq/L (ref 135–145)

## 2016-03-20 LAB — VITAMIN D 25 HYDROXY (VIT D DEFICIENCY, FRACTURES): VITD: 37.46 ng/mL (ref 30.00–100.00)

## 2016-03-25 ENCOUNTER — Ambulatory Visit (INDEPENDENT_AMBULATORY_CARE_PROVIDER_SITE_OTHER): Payer: Medicare Other | Admitting: Family Medicine

## 2016-03-25 ENCOUNTER — Encounter: Payer: Self-pay | Admitting: Family Medicine

## 2016-03-25 VITALS — BP 148/72 | HR 80 | Temp 98.2°F | Ht 70.0 in | Wt 173.8 lb

## 2016-03-25 DIAGNOSIS — Z0000 Encounter for general adult medical examination without abnormal findings: Secondary | ICD-10-CM

## 2016-03-25 DIAGNOSIS — N400 Enlarged prostate without lower urinary tract symptoms: Secondary | ICD-10-CM

## 2016-03-25 DIAGNOSIS — Z7189 Other specified counseling: Secondary | ICD-10-CM | POA: Diagnosis not present

## 2016-03-25 DIAGNOSIS — I10 Essential (primary) hypertension: Secondary | ICD-10-CM

## 2016-03-25 DIAGNOSIS — N183 Chronic kidney disease, stage 3 unspecified: Secondary | ICD-10-CM

## 2016-03-25 DIAGNOSIS — J449 Chronic obstructive pulmonary disease, unspecified: Secondary | ICD-10-CM | POA: Diagnosis not present

## 2016-03-25 DIAGNOSIS — C44209 Unspecified malignant neoplasm of skin of left ear and external auricular canal: Secondary | ICD-10-CM

## 2016-03-25 DIAGNOSIS — Z23 Encounter for immunization: Secondary | ICD-10-CM

## 2016-03-25 DIAGNOSIS — H9193 Unspecified hearing loss, bilateral: Secondary | ICD-10-CM

## 2016-03-25 NOTE — Assessment & Plan Note (Signed)
Reviewed with patient and daughter. Encouraged good hydration status.

## 2016-03-25 NOTE — Assessment & Plan Note (Signed)
Hearing aides in place 

## 2016-03-25 NOTE — Assessment & Plan Note (Signed)
Chronic, pt endorses voiding well. Continue flomax.

## 2016-03-25 NOTE — Assessment & Plan Note (Signed)
Stable off meds. ?

## 2016-03-25 NOTE — Assessment & Plan Note (Signed)
Has healed well.

## 2016-03-25 NOTE — Assessment & Plan Note (Signed)

## 2016-03-25 NOTE — Assessment & Plan Note (Signed)
Chronic, adequate on current regimen.  

## 2016-03-25 NOTE — Assessment & Plan Note (Addendum)
Advanced directives: daughter would be HCProxy. Does not want prolongation of life if burdens someone else. Does not want to go to nursing home. Has not set up living will/HCPOA but has discussed wishes with family. Discussed five wishes, provided with advanced directive packet today.

## 2016-03-25 NOTE — Patient Instructions (Addendum)
Flu shot today. If constipation trouble, could try some colace stool softener.  Make sure you drink water throughout the day for your kidneys. Advanced directive packet provided today. Look into five wishes online. You are doing well today!  Good to see you today. Return as needed or in 1 year for next physical.
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
## 2016-03-25 NOTE — Progress Notes (Signed)
Pre visit review using our clinic review tool, if applicable. No additional management support is needed unless otherwise documented below in the visit note. 

## 2016-04-03 ENCOUNTER — Other Ambulatory Visit: Payer: Self-pay | Admitting: Family Medicine

## 2016-04-13 DIAGNOSIS — IMO0002 Reserved for concepts with insufficient information to code with codable children: Secondary | ICD-10-CM

## 2016-04-13 HISTORY — DX: Reserved for concepts with insufficient information to code with codable children: IMO0002

## 2016-04-29 DIAGNOSIS — H353122 Nonexudative age-related macular degeneration, left eye, intermediate dry stage: Secondary | ICD-10-CM | POA: Diagnosis not present

## 2016-04-29 DIAGNOSIS — H353112 Nonexudative age-related macular degeneration, right eye, intermediate dry stage: Secondary | ICD-10-CM | POA: Diagnosis not present

## 2016-04-30 ENCOUNTER — Other Ambulatory Visit: Payer: Self-pay | Admitting: Family Medicine

## 2016-05-06 DIAGNOSIS — Z85828 Personal history of other malignant neoplasm of skin: Secondary | ICD-10-CM | POA: Diagnosis not present

## 2016-05-06 DIAGNOSIS — C4442 Squamous cell carcinoma of skin of scalp and neck: Secondary | ICD-10-CM | POA: Diagnosis not present

## 2016-05-13 ENCOUNTER — Encounter: Payer: Self-pay | Admitting: Family Medicine

## 2016-05-15 ENCOUNTER — Encounter: Payer: Self-pay | Admitting: Family Medicine

## 2016-05-16 ENCOUNTER — Other Ambulatory Visit: Payer: Self-pay | Admitting: Family Medicine

## 2016-06-16 DIAGNOSIS — Z85828 Personal history of other malignant neoplasm of skin: Secondary | ICD-10-CM | POA: Diagnosis not present

## 2016-06-16 DIAGNOSIS — L578 Other skin changes due to chronic exposure to nonionizing radiation: Secondary | ICD-10-CM | POA: Diagnosis not present

## 2016-06-24 ENCOUNTER — Encounter: Payer: Self-pay | Admitting: Internal Medicine

## 2016-06-24 ENCOUNTER — Ambulatory Visit (INDEPENDENT_AMBULATORY_CARE_PROVIDER_SITE_OTHER): Payer: Medicare Other | Admitting: Internal Medicine

## 2016-06-24 VITALS — BP 134/62 | HR 71 | Temp 98.1°F | Wt 173.0 lb

## 2016-06-24 DIAGNOSIS — R05 Cough: Secondary | ICD-10-CM | POA: Diagnosis not present

## 2016-06-24 DIAGNOSIS — C44229 Squamous cell carcinoma of skin of left ear and external auricular canal: Secondary | ICD-10-CM | POA: Diagnosis not present

## 2016-06-24 DIAGNOSIS — H903 Sensorineural hearing loss, bilateral: Secondary | ICD-10-CM | POA: Diagnosis not present

## 2016-06-24 DIAGNOSIS — R059 Cough, unspecified: Secondary | ICD-10-CM

## 2016-06-24 DIAGNOSIS — R0982 Postnasal drip: Secondary | ICD-10-CM | POA: Diagnosis not present

## 2016-06-24 NOTE — Progress Notes (Signed)
HPI  Pt presents to the clinic today with c/o cough. This started 1 week ago. The cough is productive of yellow mucous. He denies runny nose, nasal congestion, ear pain, sore throat or shortness of breath. He denies fever, chills or body aches. He has not tried anything OTC for this. H e has a history of COPD but is not on any maintenance inhalers. He has not had sick contacts that he is aware of. He is UTD on his flu and pneumonia vaccines.  Review of Systems        Past Medical History:  Diagnosis Date  . BPH (benign prostatic hyperplasia)    s/p TURP 2006  . 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 CKD (chronic kidney disease) stage 3, GFR 30-59 ml/min 01/27/2007  . COPD (chronic obstructive pulmonary disease) ([place])    by xray  . Hearing impairment    bilateral severe sensori-neural, hearing aids, last audiology eval 06/2011  ([facility])  . Hypertension   . Kidney stone 03/2011   obstructive R upper ureteral stone s/p stent placement/ureteral dilatation, stent removal, likely passed.  remaining 3 mm R lower pole stone asxs  . Macular degeneration 12/13/2012  . Solitary lung nodule 03/2011   89mm R lung base, stable on rpt CT 10/2011, consider rpt 2 yrs  . Squamous cell carcinoma 04/2016   invasive, mid crown scalp Allyson Sabal)    Family History  Problem Relation Age of Onset  . Heart disease Mother     heart trouble, arrhythmia  . Diabetes Mother   . Cancer Father     skin in mouth  . Heart disease Sister     CAD  . Diabetes Sister   . Heart disease Brother     A. Fib  . Heart disease Daughter     CAD  . Dementia Daughter   . Hypertension Brother   . Cancer Son     lymphoma  . Stroke Neg Hx     Social History   Social History  . Marital status: Married    Spouse name: N/A  . Number of children: 5  . Years of education: N/A   Occupational History  . 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 Smoking status: Former Smoker    Packs/day: 1.50    Years: 30.00    Quit date: 07/14/1978  . Smokeless tobacco: Never Used     Comment: Quit 1974  . Alcohol use No  . Drug use: No  . Sexual activity: Not on file   Other Topics Concern  . Not on file   Social History Narrative   Lives alone. Family nearby.   Married, wife lives in nursing home 12/03 (smoking, falling)   Children - 5- son dec. 12/08   Edu: 10th grade   Diet: good water, fruits/vegetables daily    No Known Allergies   Constitutional: Denies headache, fatigue, fever or abrupt weight changes.  HEENT:  Denies eye redness, eye pain, pressure behind the eyes, facial pain, nasal congestion, ear pain, ringing in the ears, wax buildup, runny nose or sore throat. Respiratory: Positive cough. Denies difficulty breathing or shortness of breath.  Cardiovascular: Denies chest pain, chest tightness, palpitations or swelling in the hands or feet.   No other specific complaints in a complete review of systems (except as listed in  HPI above).  Objective:   BP 134/62   Pulse 71   Temp 98.1 F (36.7 C) (Oral)   Wt 173 lb (78.5 kg)   SpO2 96%   BMI 24.82 kg/m   Wt Readings from Last 3 Encounters:  06/24/16 173 lb (78.5 kg)  03/25/16 173 lb 12 oz (78.8 kg)  03/20/15 179 lb 12 oz (81.5 kg)     General: Appears his stated age, in NAD. HEENT: Head: normal shape and size, no sinus tenderness noted; Eyes: sclera white, no icterus, conjunctiva pink;  Nose: mucosa pink and moist, septum midline; Throat/Mouth: + PND. Teeth present, mucosa pink and moist, no exudate noted, no lesions or ulcerations noted.  Neck: No cervical lymphadenopathy.  Pulmonary/Chest: Normal effort and positive vesicular breath sounds. No respiratory distress. No wheezes, rales or ronchi noted.       Assessment & Plan:   Cough secondary to PND:  Get some rest and drink plenty of  water Can try Claritin 10 mg PO QS x 3 nights, want to avoid long term due to BPH Delsym as needed for cough Return precautions discussed  RTC as needed or if symptoms persist.   Webb Silversmith, NP

## 2016-06-24 NOTE — Patient Instructions (Signed)
Cough, Adult Introduction A cough helps to clear your throat and lungs. A cough may last only 2-3 weeks (acute), or it may last longer than 8 weeks (chronic). Many different things can cause a cough. A cough may be a sign of an illness or another medical condition. Follow these instructions at home:  Pay attention to any changes in your cough.  Take medicines only as told by your doctor.  If you were prescribed an antibiotic medicine, take it as told by your doctor. Do not stop taking it even if you start to feel better.  Talk with your doctor before you try using a cough medicine.  Drink enough fluid to keep your pee (urine) clear or pale yellow.  If the air is dry, use a cold steam vaporizer or humidifier in your home.  Stay away from things that make you cough at work or at home.  If your cough is worse at night, try using extra pillows to raise your head up higher while you sleep.  Do not smoke, and try not to be around smoke. If you need help quitting, ask your doctor.  Do not have caffeine.  Do not drink alcohol.  Rest as needed. Contact a doctor if:  You have new problems (symptoms).  You cough up yellow fluid (pus).  Your cough does not get better after 2-3 weeks, or your cough gets worse.  Medicine does not help your cough and you are not sleeping well.  You have pain that gets worse or pain that is not helped with medicine.  You have a fever.  You are losing weight and you do not know why.  You have night sweats. Get help right away if:  You cough up blood.  You have trouble breathing.  Your heartbeat is very fast. This information is not intended to replace advice given to you by your health care provider. Make sure you discuss any questions you have with your health care provider. Document Released: 03/13/2011 Document Revised: 12/06/2015 Document Reviewed: 09/06/2014  2017 Elsevier  

## 2016-09-16 DIAGNOSIS — Z85828 Personal history of other malignant neoplasm of skin: Secondary | ICD-10-CM | POA: Diagnosis not present

## 2016-09-16 DIAGNOSIS — L905 Scar conditions and fibrosis of skin: Secondary | ICD-10-CM | POA: Diagnosis not present

## 2016-09-16 DIAGNOSIS — L570 Actinic keratosis: Secondary | ICD-10-CM | POA: Diagnosis not present

## 2016-09-26 DIAGNOSIS — H903 Sensorineural hearing loss, bilateral: Secondary | ICD-10-CM | POA: Diagnosis not present

## 2016-10-28 DIAGNOSIS — H353132 Nonexudative age-related macular degeneration, bilateral, intermediate dry stage: Secondary | ICD-10-CM | POA: Diagnosis not present

## 2016-12-30 DIAGNOSIS — Z961 Presence of intraocular lens: Secondary | ICD-10-CM | POA: Diagnosis not present

## 2016-12-30 DIAGNOSIS — H524 Presbyopia: Secondary | ICD-10-CM | POA: Diagnosis not present

## 2017-03-17 DIAGNOSIS — L570 Actinic keratosis: Secondary | ICD-10-CM | POA: Diagnosis not present

## 2017-03-17 DIAGNOSIS — D229 Melanocytic nevi, unspecified: Secondary | ICD-10-CM | POA: Diagnosis not present

## 2017-03-17 DIAGNOSIS — D1801 Hemangioma of skin and subcutaneous tissue: Secondary | ICD-10-CM | POA: Diagnosis not present

## 2017-03-17 DIAGNOSIS — L910 Hypertrophic scar: Secondary | ICD-10-CM | POA: Diagnosis not present

## 2017-03-17 DIAGNOSIS — L821 Other seborrheic keratosis: Secondary | ICD-10-CM | POA: Diagnosis not present

## 2017-03-24 ENCOUNTER — Other Ambulatory Visit: Payer: Self-pay | Admitting: Family Medicine

## 2017-03-24 DIAGNOSIS — N183 Chronic kidney disease, stage 3 unspecified: Secondary | ICD-10-CM

## 2017-03-24 DIAGNOSIS — I10 Essential (primary) hypertension: Secondary | ICD-10-CM

## 2017-03-25 ENCOUNTER — Other Ambulatory Visit (INDEPENDENT_AMBULATORY_CARE_PROVIDER_SITE_OTHER): Payer: Medicare Other

## 2017-03-25 DIAGNOSIS — I10 Essential (primary) hypertension: Secondary | ICD-10-CM

## 2017-03-25 DIAGNOSIS — N183 Chronic kidney disease, stage 3 unspecified: Secondary | ICD-10-CM

## 2017-03-25 LAB — RENAL FUNCTION PANEL
Albumin: 3.5 g/dL (ref 3.5–5.2)
BUN: 49 mg/dL — ABNORMAL HIGH (ref 6–23)
CO2: 29 meq/L (ref 19–32)
Calcium: 9.6 mg/dL (ref 8.4–10.5)
Chloride: 106 meq/L (ref 96–112)
Creatinine, Ser: 2.32 mg/dL — ABNORMAL HIGH (ref 0.40–1.50)
GFR: 28.12 mL/min — ABNORMAL LOW
Glucose, Bld: 99 mg/dL (ref 70–99)
Phosphorus: 2.8 mg/dL (ref 2.3–4.6)
Potassium: 5.2 meq/L — ABNORMAL HIGH (ref 3.5–5.1)
Sodium: 142 meq/L (ref 135–145)

## 2017-03-25 LAB — VITAMIN D 25 HYDROXY (VIT D DEFICIENCY, FRACTURES): VITD: 42.77 ng/mL (ref 30.00–100.00)

## 2017-03-25 LAB — CBC WITH DIFFERENTIAL/PLATELET
Basophils Absolute: 0.1 K/uL (ref 0.0–0.1)
Basophils Relative: 0.9 % (ref 0.0–3.0)
Eosinophils Absolute: 0.4 K/uL (ref 0.0–0.7)
Eosinophils Relative: 4.5 % (ref 0.0–5.0)
HCT: 39.6 % (ref 39.0–52.0)
Hemoglobin: 12.8 g/dL — ABNORMAL LOW (ref 13.0–17.0)
Lymphocytes Relative: 17.6 % (ref 12.0–46.0)
Lymphs Abs: 1.4 K/uL (ref 0.7–4.0)
MCHC: 32.3 g/dL (ref 30.0–36.0)
MCV: 94.9 fl (ref 78.0–100.0)
Monocytes Absolute: 0.5 K/uL (ref 0.1–1.0)
Monocytes Relative: 6.1 % (ref 3.0–12.0)
Neutro Abs: 5.7 K/uL (ref 1.4–7.7)
Neutrophils Relative %: 70.9 % (ref 43.0–77.0)
Platelets: 332 K/uL (ref 150.0–400.0)
RBC: 4.18 Mil/uL — ABNORMAL LOW (ref 4.22–5.81)
RDW: 13.7 % (ref 11.5–15.5)
WBC: 8 K/uL (ref 4.0–10.5)

## 2017-03-25 LAB — MICROALBUMIN / CREATININE URINE RATIO
CREATININE, U: 113 mg/dL
MICROALB/CREAT RATIO: 1.1 mg/g (ref 0.0–30.0)
Microalb, Ur: 1.2 mg/dL (ref 0.0–1.9)

## 2017-03-25 LAB — LIPID PANEL
Cholesterol: 103 mg/dL (ref 0–200)
HDL: 42.8 mg/dL
LDL Cholesterol: 45 mg/dL (ref 0–99)
NonHDL: 60.25
Total CHOL/HDL Ratio: 2
Triglycerides: 78 mg/dL (ref 0.0–149.0)
VLDL: 15.6 mg/dL (ref 0.0–40.0)

## 2017-03-25 LAB — TSH: TSH: 1.14 u[IU]/mL (ref 0.35–4.50)

## 2017-03-26 LAB — PARATHYROID HORMONE, INTACT (NO CA): PTH: 28 pg/mL (ref 14–64)

## 2017-03-27 ENCOUNTER — Encounter: Payer: Medicare Other | Admitting: Family Medicine

## 2017-03-31 ENCOUNTER — Encounter: Payer: Self-pay | Admitting: Family Medicine

## 2017-03-31 ENCOUNTER — Ambulatory Visit (INDEPENDENT_AMBULATORY_CARE_PROVIDER_SITE_OTHER): Payer: Medicare Other | Admitting: Family Medicine

## 2017-03-31 VITALS — BP 100/58 | HR 79 | Temp 97.6°F | Ht 65.75 in | Wt 147.0 lb

## 2017-03-31 DIAGNOSIS — Z0000 Encounter for general adult medical examination without abnormal findings: Secondary | ICD-10-CM

## 2017-03-31 DIAGNOSIS — D649 Anemia, unspecified: Secondary | ICD-10-CM | POA: Insufficient documentation

## 2017-03-31 DIAGNOSIS — N184 Chronic kidney disease, stage 4 (severe): Secondary | ICD-10-CM

## 2017-03-31 DIAGNOSIS — Z23 Encounter for immunization: Secondary | ICD-10-CM | POA: Diagnosis not present

## 2017-03-31 DIAGNOSIS — Z7189 Other specified counseling: Secondary | ICD-10-CM | POA: Diagnosis not present

## 2017-03-31 DIAGNOSIS — N400 Enlarged prostate without lower urinary tract symptoms: Secondary | ICD-10-CM | POA: Diagnosis not present

## 2017-03-31 DIAGNOSIS — R911 Solitary pulmonary nodule: Secondary | ICD-10-CM

## 2017-03-31 DIAGNOSIS — R634 Abnormal weight loss: Secondary | ICD-10-CM | POA: Insufficient documentation

## 2017-03-31 DIAGNOSIS — H9193 Unspecified hearing loss, bilateral: Secondary | ICD-10-CM | POA: Diagnosis not present

## 2017-03-31 DIAGNOSIS — G3184 Mild cognitive impairment, so stated: Secondary | ICD-10-CM | POA: Diagnosis not present

## 2017-03-31 DIAGNOSIS — J449 Chronic obstructive pulmonary disease, unspecified: Secondary | ICD-10-CM | POA: Diagnosis not present

## 2017-03-31 DIAGNOSIS — I10 Essential (primary) hypertension: Secondary | ICD-10-CM | POA: Diagnosis not present

## 2017-03-31 MED ORDER — LISINOPRIL 20 MG PO TABS: 20.0000 mg | ORAL_TABLET | Freq: Every day | ORAL | 11 refills | Status: DC

## 2017-03-31 NOTE — Assessment & Plan Note (Signed)
Progressive per daughter. ?Senile dementia.  Increased level of care need noted - more trouble with bowel/bladder incontinence now using depends pad more regularly. Support provided.

## 2017-03-31 NOTE — Assessment & Plan Note (Signed)
Has fully lost L side hearing, R side down to 20%. Wears hearing aides in right ear.

## 2017-03-31 NOTE — Assessment & Plan Note (Signed)
H/o lung nodule R lung base 2013, stable on repeat imaging. Consider updated lung imaging (CXR vs CT).

## 2017-03-31 NOTE — Assessment & Plan Note (Signed)
Continue flomax daily.
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
## 2017-03-31 NOTE — Assessment & Plan Note (Signed)
Progression noted - anticipate related to hypotension from over-treatment of blood pressure after recent weight loss noted. Will decrease lisinopril to 20mg  once daily and monitor effect. Daughter agrees with plan.

## 2017-03-31 NOTE — Assessment & Plan Note (Signed)
Stable off meds. ?

## 2017-03-31 NOTE — Patient Instructions (Addendum)
Flu shot today Kidneys are a bit worse and blood pressure is low - decrease lisinopril to 20mg  once daily. Continue hydrochlorothiazide. For mild anemia and weight loss - we will check urine and stool for hidden blood.  Continue eating well and drinking plenty of water for kidneys.  Return in 2 months for follow up visit, sooner if needed.   Health Maintenance, Male A healthy lifestyle and preventive care is important for your health and wellness. Ask your health care provider about what schedule of regular examinations is right for you. What should I know about weight and diet? Eat a Healthy Diet  Eat plenty of vegetables, fruits, whole grains, low-fat dairy products, and lean protein.  Do not eat a lot of foods high in solid fats, added sugars, or salt.  Maintain a Healthy Weight Regular exercise can help you achieve or maintain a healthy weight. You should:  Do at least 150 minutes of exercise each week. The exercise should increase your heart rate and make you sweat (moderate-intensity exercise).  Do strength-training exercises at least twice a week.  Watch Your Levels of Cholesterol and Blood Lipids  Have your blood tested for lipids and cholesterol every 5 years starting at 81 years of age. If you are at high risk for heart disease, you should start having your blood tested when you are 81 years old. You may need to have your cholesterol levels checked more often if: ? Your lipid or cholesterol levels are high. ? You are older than 81 years of age. ? You are at high risk for heart disease.  What should I know about cancer screening? Many types of cancers can be detected early and may often be prevented. Lung Cancer  You should be screened every year for lung cancer if: ? You are a current smoker who has smoked for at least 30 years. ? You are a former smoker who has quit within the past 15 years.  Talk to your health care provider about your screening options, when you  should start screening, and how often you should be screened.  Colorectal Cancer  Routine colorectal cancer screening usually begins at 81 years of age and should be repeated every 5-10 years until you are 81 years old. You may need to be screened more often if early forms of precancerous polyps or small growths are found. Your health care provider may recommend screening at an earlier age if you have risk factors for colon cancer.  Your health care provider may recommend using home test kits to check for hidden blood in the stool.  A small camera at the end of a tube can be used to examine your colon (sigmoidoscopy or colonoscopy). This checks for the earliest forms of colorectal cancer.  Prostate and Testicular Cancer  Depending on your age and overall health, your health care provider may do certain tests to screen for prostate and testicular cancer.  Talk to your health care provider about any symptoms or concerns you have about testicular or prostate cancer.  Skin Cancer  Check your skin from head to toe regularly.  Tell your health care provider about any new moles or changes in moles, especially if: ? There is a change in a mole's size, shape, or color. ? You have a mole that is larger than a pencil eraser.  Always use sunscreen. Apply sunscreen liberally and repeat throughout the day.  Protect yourself by wearing long sleeves, pants, a wide-brimmed hat, and sunglasses when outside.  What should I know about heart disease, diabetes, and high blood pressure?  If you are 35-56 years of age, have your blood pressure checked every 3-5 years. If you are 74 years of age or older, have your blood pressure checked every year. You should have your blood pressure measured twice-once when you are at a hospital or clinic, and once when you are not at a hospital or clinic. Record the average of the two measurements. To check your blood pressure when you are not at a hospital or clinic, you  can use: ? An automated blood pressure machine at a pharmacy. ? A home blood pressure monitor.  Talk to your health care provider about your target blood pressure.  If you are between 25-30 years old, ask your health care provider if you should take aspirin to prevent heart disease.  Have regular diabetes screenings by checking your fasting blood sugar level. ? If you are at a normal weight and have a low risk for diabetes, have this test once every three years after the age of 55. ? If you are overweight and have a high risk for diabetes, consider being tested at a younger age or more often.  A one-time screening for abdominal aortic aneurysm (AAA) by ultrasound is recommended for men aged 51-75 years who are current or former smokers. What should I know about preventing infection? Hepatitis B If you have a higher risk for hepatitis B, you should be screened for this virus. Talk with your health care provider to find out if you are at risk for hepatitis B infection. Hepatitis C Blood testing is recommended for:  Everyone born from [birth year] through 1963.  Anyone with known risk factors for hepatitis C.  Sexually Transmitted Diseases (STDs)  You should be screened each year for STDs including gonorrhea and chlamydia if: ? You are sexually active and are younger than 80 years of age. ? You are older than 81 years of age and your health care provider tells you that you are at risk for this type of infection. ? Your sexual activity has changed since you were last screened and you are at an increased risk for chlamydia or gonorrhea. Ask your health care provider if you are at risk.  Talk with your health care provider about whether you are at high risk of being infected with HIV. Your health care provider may recommend a prescription medicine to help prevent HIV infection.  What else can I do?  Schedule regular health, dental, and eye exams.  Stay current with your vaccines  (immunizations).  Do not use any tobacco products, such as cigarettes, chewing tobacco, and e-cigarettes. If you need help quitting, ask your health care provider.  Limit alcohol intake to no more than 2 drinks per day. One drink equals 12 ounces of beer, 5 ounces of wine, or 1 ounces of hard liquor.  Do not use street drugs.  Do not share needles.  Ask your health care provider for help if you need support or information about quitting drugs.  Tell your health care provider if you often feel depressed.  Tell your health care provider if you have ever been abused or do not feel safe at home. This information is not intended to replace advice given to you by your health care provider. Make sure you discuss any questions you have with your health care provider. Document Released: 12/27/2007 Document Revised: 02/27/2016 Document Reviewed: 04/03/2015 Elsevier Interactive Patient Education  [employer].

## 2017-03-31 NOTE — Assessment & Plan Note (Signed)
New.  Discussed possible etiologies as well as discussed how aggressive to be in this pleasant 83yo. Will start with iFOB and UA today.

## 2017-03-31 NOTE — Assessment & Plan Note (Signed)
New 25+ lb weight loss over the past year despite persistently good diet. This in association with mild anemia noted today. Discussed possible DDx and evaluation options. I did ask to check UA, iFOB today (unable to return iFOB, will need done next visit). Discussed possible CXR vs further workup pending results of these 2 tests. I did ask them to return in 1-2 months for f/u visit weight.

## 2017-03-31 NOTE — Assessment & Plan Note (Signed)
Preventative protocols reviewed and updated unless pt declined. Discussed healthy diet and lifestyle.  

## 2017-03-31 NOTE — Assessment & Plan Note (Signed)
Advanced directives: daughter would be HCProxy. Does not want prolongation of life if burdens someone else. Does not want to go to nursing home. Advanced directive is filled out but hasn't been notarized. Daughter is durable power of attorney.

## 2017-03-31 NOTE — Assessment & Plan Note (Signed)
Chronic, hypotensive today - will decrease lisinopril to 20mg  once daily. Continue hctz 25mg  daily.

## 2017-04-28 ENCOUNTER — Other Ambulatory Visit: Payer: Self-pay | Admitting: Family Medicine

## 2017-04-28 DIAGNOSIS — H353132 Nonexudative age-related macular degeneration, bilateral, intermediate dry stage: Secondary | ICD-10-CM | POA: Diagnosis not present

## 2017-05-01 NOTE — Telephone Encounter (Signed)
Diane (DPR signed) checking on refill for tamsulosin. I spoke with [facility] and rx ready for pick up.diane voiced understanding.

## 2017-05-05 ENCOUNTER — Other Ambulatory Visit (INDEPENDENT_AMBULATORY_CARE_PROVIDER_SITE_OTHER): Payer: Medicare Other

## 2017-05-05 ENCOUNTER — Telehealth: Payer: Self-pay | Admitting: Family Medicine

## 2017-05-05 DIAGNOSIS — R319 Hematuria, unspecified: Secondary | ICD-10-CM | POA: Diagnosis not present

## 2017-05-05 LAB — POC URINALSYSI DIPSTICK (AUTOMATED)
BILIRUBIN UA: NEGATIVE
GLUCOSE UA: NEGATIVE
KETONES UA: NEGATIVE
Leukocytes, UA: NEGATIVE
NITRITE UA: NEGATIVE
PH UA: 6 (ref 5.0–8.0)
Protein, UA: NEGATIVE
RBC UA: NEGATIVE
Spec Grav, UA: 1.025 (ref 1.010–1.025)
Urobilinogen, UA: 1 E.U./dL

## 2017-05-05 NOTE — Telephone Encounter (Signed)
Copied from [place] #[id number]. Topic: Quick Communication - See Telephone Encounter >> May 05, 2017  1:45 PM Ether Griffins B wrote: CRM for notification. See Telephone encounter for:  05/05/17.  pts daughter would like you to call about her father, hes been failing to recognize when he needs to use the restroom. Would like Dr Synthia Innocent nurse to call her back.

## 2017-05-06 ENCOUNTER — Other Ambulatory Visit: Payer: Medicare Other

## 2017-05-06 DIAGNOSIS — D649 Anemia, unspecified: Secondary | ICD-10-CM

## 2017-05-06 LAB — FECAL OCCULT BLOOD, IMMUNOCHEMICAL: FECAL OCCULT BLD: NEGATIVE

## 2017-05-07 MED ORDER — SERTRALINE HCL 25 MG PO TABS: 25.0000 mg | ORAL_TABLET | Freq: Every day | ORAL | 3 refills | Status: DC

## 2017-05-07 NOTE — Telephone Encounter (Signed)
Spoke with pt's daughter, Shauna Hugh (on dpr). States pt is having decrease in daily personal care (i.e., dressing, bathing, cleaning after restroom visit, etc.). Also, urinating and BMs on himself. Now wears Depends. Not able to keep meds straight or know when to take them. Losing interest in pleasure activities (i.e., reading newspaper, going to church, mowing lawn, etc.). Says pt does not visit anyone and seems to be bored. Diane will going out of town tomorrow for the weekend and will return Tues, 05/12/17.

## 2017-05-07 NOTE — Telephone Encounter (Signed)
Spoke w daughter.  Sense of apathy.  rec trial sertraline 25mg  daily.  Predominant trouble is bowel incontinence.  Discussed options - will provide with # for outpatient Dementia Support services to check on [place] equivalent. Discussed palliative care vs [facility] referral for case management eval/home eval. She will talk with her family.  [facility] at [phone] and ask for Lutricia Horsfall with Dementia Support Services.

## 2017-05-19 ENCOUNTER — Telehealth: Payer: Self-pay

## 2017-05-19 DIAGNOSIS — R413 Other amnesia: Secondary | ICD-10-CM

## 2017-05-19 NOTE — Telephone Encounter (Signed)
First seeing this today.  I put in the referral for palliative care based on Dr. Synthia Innocent note from 05/05/17.  Thanks.

## 2017-05-19 NOTE — Telephone Encounter (Signed)
Copied from [place] #[id number]. Topic: Inquiry >> May 19, 2017  9:20 AM Malena Catholic I, NT wrote: Reason for [id number] care need form to be fills and send It a pt Of Doctor G  >> May 19, 2017  9:36 AM Myatt, Marland Kitchen wrote: Not enough information on pt to forward to PCP. Dr Darnell Level??  >> May 19, 2017  9:58 AM Vernona Rieger wrote: Pt's daughter is on the phone and said the social workers of [place], they need a script from Dr Darnell Level to work services with her dad for pallative care. Could someone please give her a call back, her name is Shauna Hugh [phone]. She says they requested the information atleast a week - two weeks ago.

## 2017-05-19 NOTE — Telephone Encounter (Signed)
Also see 05/05/17 phonenote. Thank you.

## 2017-05-20 ENCOUNTER — Telehealth: Payer: Self-pay | Admitting: Family Medicine

## 2017-05-20 NOTE — Telephone Encounter (Signed)
Noted. Ronnie Brown is currently working on this.

## 2017-05-20 NOTE — Telephone Encounter (Signed)
Called daughter Shauna Hugh, told her Dr Damita Dunnings has placed the Palliative Care order. Spoke with Neoma Laming at [facility] and [place] office. She accepted a Verbal order for Palliative Care until Dr Danise Mina is back on Tuesday and can fill out the form and fax it to them. Palliative Care form in Dr Synthia Innocent in box. All records and insurance  card faxed to Neoma Laming at [facility] office, she will contact the daughter Shauna Hugh to start this referral.

## 2017-05-20 NOTE — Telephone Encounter (Signed)
Thanks

## 2017-05-20 NOTE — Telephone Encounter (Signed)
Noted! Thank you

## 2017-05-25 DIAGNOSIS — I129 Hypertensive chronic kidney disease with stage 1 through stage 4 chronic kidney disease, or unspecified chronic kidney disease: Secondary | ICD-10-CM | POA: Diagnosis not present

## 2017-06-02 ENCOUNTER — Encounter: Payer: Self-pay | Admitting: Family Medicine

## 2017-06-02 ENCOUNTER — Ambulatory Visit (INDEPENDENT_AMBULATORY_CARE_PROVIDER_SITE_OTHER): Payer: Medicare Other | Admitting: Family Medicine

## 2017-06-02 VITALS — BP 116/60 | HR 90 | Temp 98.0°F | Wt 149.0 lb

## 2017-06-02 DIAGNOSIS — R152 Fecal urgency: Secondary | ICD-10-CM | POA: Diagnosis not present

## 2017-06-02 DIAGNOSIS — G3184 Mild cognitive impairment, so stated: Secondary | ICD-10-CM

## 2017-06-02 DIAGNOSIS — I10 Essential (primary) hypertension: Secondary | ICD-10-CM

## 2017-06-02 DIAGNOSIS — R32 Unspecified urinary incontinence: Secondary | ICD-10-CM | POA: Diagnosis not present

## 2017-06-02 DIAGNOSIS — R159 Full incontinence of feces: Secondary | ICD-10-CM | POA: Diagnosis not present

## 2017-06-02 DIAGNOSIS — H9193 Unspecified hearing loss, bilateral: Secondary | ICD-10-CM | POA: Diagnosis not present

## 2017-06-02 DIAGNOSIS — R634 Abnormal weight loss: Secondary | ICD-10-CM

## 2017-06-02 MED ORDER — TAMSULOSIN HCL 0.4 MG PO CAPS: 0.4000 mg | ORAL_CAPSULE | Freq: Every day | ORAL | 6 refills | Status: AC

## 2017-06-02 MED ORDER — SERTRALINE HCL 25 MG PO TABS: 25.0000 mg | ORAL_TABLET | Freq: Every day | ORAL | 6 refills | Status: AC

## 2017-06-02 MED ORDER — HYDROCHLOROTHIAZIDE 25 MG PO TABS: 25.0000 mg | ORAL_TABLET | Freq: Every morning | ORAL | 6 refills | Status: DC

## 2017-06-02 MED ORDER — LISINOPRIL 20 MG PO TABS: 20.0000 mg | ORAL_TABLET | Freq: Every day | ORAL | 6 refills | Status: AC

## 2017-06-02 NOTE — Patient Instructions (Addendum)
Keep an eye on blood pressures - once a week if possible. Let me know if too high or too low. Continue checking weight weekly and let me know how we're doing.  You are doing well today.  Continue current medicines Return in 3 months for follow up visit.  Pass by lab to pick up stool kit.

## 2017-06-02 NOTE — Progress Notes (Signed)
BP 116/60 (BP Location: Left Arm, Patient Position: Sitting, Cuff Size: Normal)   Pulse 90   Temp 98 F (36.7 C) (Oral)   Wt 149 lb (67.6 kg)   SpO2 90%   BMI 24.23 kg/m    CC: 2 mo f/u visit Subjective:    Patient ID: Ronnie Brown, male    DOB: 1923-05-20, 83 y.o.   MRN: [id number]  HPI: Ronnie Brown is a 83 y.o. male presenting on 06/02/2017 for 2 mo follow-up   See prior note and recent phone note for details. Lives with son and DIL. Here today with other daughter, Shauna Hugh.  Increase in personal care needs, predominant trouble with bowel continence.  Palliative care now involved. Home palliative care team did home visit including social worker - discussed home care or higher level of care options, suggested engaging someone to help with personal care services. Medicaid application form started. Discussing transition to [facility] assisted living - pt agrees.   Noted difficulty finding words.  Sertraline 25mg  started last visit for apathy. Possible mild improvement.  Severe hearing loss.   Last visit bp medication was decreased due to noted hypotension - and levels improved today. Doesn't check bp at home. No cuff. No dizziness or lightheadedness.   Relevant past medical, surgical, family and social history reviewed and updated as indicated. Interim medical history since our last visit reviewed. Allergies and medications reviewed and updated. Outpatient Medications Prior to Visit  Medication Sig Dispense Refill  . acetaminophen (TYLENOL) 500 MG tablet Take 500 mg by mouth as needed.      . hydrochlorothiazide (HYDRODIURIL) 25 MG tablet TAKE 1 TABLET BY MOUTH EVERY MORNING. 30 tablet 11  . lisinopril (PRINIVIL,ZESTRIL) 20 MG tablet Take 1 tablet (20 mg total) by mouth daily. 30 tablet 11  . sertraline (ZOLOFT) 25 MG tablet Take 1 tablet (25 mg total) by mouth daily. 30 tablet 3  . tamsulosin (FLOMAX) 0.4 MG CAPS capsule TAKE 1 CAPSULE BY MOUTH DAILY. 30 capsule 1   No  facility-administered medications prior to visit.      Per HPI unless specifically indicated in ROS section below Review of Systems     Objective:    BP 116/60 (BP Location: Left Arm, Patient Position: Sitting, Cuff Size: Normal)   Pulse 90   Temp 98 F (36.7 C) (Oral)   Wt 149 lb (67.6 kg)   SpO2 90%   BMI 24.23 kg/m   Wt Readings from Last 3 Encounters:  06/02/17 149 lb (67.6 kg)  03/31/17 147 lb (66.7 kg)  06/24/16 173 lb (78.5 kg)    Physical Exam  Constitutional: He appears well-developed and well-nourished. No distress.  HENT:  Mouth/Throat: Oropharynx is clear and moist. No oropharyngeal exudate.  Cardiovascular: Normal rate, regular rhythm, normal heart sounds and intact distal pulses.  No murmur heard. Pulmonary/Chest: Effort normal and breath sounds normal. No respiratory distress. He has no wheezes. He has no rales.  Musculoskeletal: He exhibits no edema.  Skin: Skin is warm and dry. No rash noted.  Psychiatric: He has a normal mood and affect.  Nursing note and vitals reviewed.  Results for orders placed or performed in visit on 05/06/17  Fecal occult blood, imunochemical  Result Value Ref Range   Fecal Occult Bld Negative Negative   Lab Results  Component Value Date   CREATININE 2.32 (H) 03/25/2017    Lab Results  Component Value Date   WBC 8.0 03/25/2017   HGB 12.8 (L) 03/25/2017  HCT 39.6 03/25/2017   MCV 94.9 03/25/2017   PLT 332.0 03/25/2017       Assessment & Plan:   Problem List Items Addressed This Visit    Bowel and bladder incontinence - Primary    Bowel > bladder. This is limiting ability for family to care for him at home. Discussing possible transition to ALF. Given progression of bowel incontinence, will check GI pathogen panel r/o infectious diarrhea as cause.       Essential hypertension    bp better controlled today. Continue lower doses. Consider further titration if needed.       Relevant Medications   lisinopril  (PRINIVIL,ZESTRIL) 20 MG tablet   hydrochlorothiazide (HYDRODIURIL) 25 MG tablet   Hearing impairment   MCI (mild cognitive impairment) with memory loss    Progression noted. Will continue to monitor. Palliative care home team involved.  Possibly doing better on low dose sertraline.       Weight loss, unintentional    Weight has stabilized (2lb weight gain). Family more cognisant of nutrition status and regular meals. Will continue to monitor.  Recheck 3 mo f/u visit.        Other Visit Diagnoses    Incontinence of feces with fecal urgency       Relevant Orders   Gastrointestinal Pathogen Panel PCR       Follow up plan: Return in about 3 months (around 09/02/2017) for follow up visit.  Ria Bush, MD    Margit Hanks at [facility].

## 2017-06-03 DIAGNOSIS — R32 Unspecified urinary incontinence: Principal | ICD-10-CM

## 2017-06-03 DIAGNOSIS — R159 Full incontinence of feces: Secondary | ICD-10-CM | POA: Insufficient documentation

## 2017-06-03 NOTE — Assessment & Plan Note (Signed)
Bowel > bladder. This is limiting ability for family to care for him at home. Discussing possible transition to ALF. Given progression of bowel incontinence, will check GI pathogen panel r/o infectious diarrhea as cause.

## 2017-06-03 NOTE — Assessment & Plan Note (Addendum)
Progression noted. Will continue to monitor. Palliative care home team involved.  Possibly doing better on low dose sertraline.

## 2017-06-03 NOTE — Assessment & Plan Note (Addendum)
Weight has stabilized (2lb weight gain). Family more cognisant of nutrition status and regular meals. Will continue to monitor.  Recheck 3 mo f/u visit.

## 2017-06-03 NOTE — Assessment & Plan Note (Addendum)
bp better controlled today. Continue lower doses. Consider further titration if needed.

## 2017-06-09 NOTE — Addendum Note (Signed)
Addended by: Ellamae Sia on: 06/09/2017 11:33 AM   Modules accepted: Orders

## 2017-06-23 ENCOUNTER — Telehealth: Payer: Self-pay | Admitting: Family Medicine

## 2017-06-23 NOTE — Telephone Encounter (Signed)
Copied from [place] [id number]. Topic: Quick Communication - See Telephone Encounter >> Jun 23, 2017 10:22 AM Bea Graff, NT wrote: CRM for notification. See Telephone encounter for: Ronnie Brown, calling requesting an FL2 form to be completed for her dad from Dr. Danise Mina. States they did have a meeting in October regarding this. Please contact daughter when completed.  06/23/17.

## 2017-06-24 NOTE — Telephone Encounter (Signed)
I can complete. plz have them drop off FL2 form - usually SNF provides. Can you also check with [facility] to see if we have them here?

## 2017-06-25 NOTE — Telephone Encounter (Signed)
Left message for pt's daughter, Shauna Hugh (on dpr), to call back.  Need to notify her the pt's form is ready to pick up. [Placed form at front office.]

## 2017-06-25 NOTE — Telephone Encounter (Signed)
Placed form in Dr. G's box.  

## 2017-06-25 NOTE — Telephone Encounter (Signed)
Form filled out and in CMA box. plz attach med list.

## 2017-06-30 DIAGNOSIS — H903 Sensorineural hearing loss, bilateral: Secondary | ICD-10-CM | POA: Diagnosis not present

## 2017-06-30 DIAGNOSIS — H6062 Unspecified chronic otitis externa, left ear: Secondary | ICD-10-CM | POA: Diagnosis not present

## 2017-06-30 DIAGNOSIS — H6122 Impacted cerumen, left ear: Secondary | ICD-10-CM | POA: Diagnosis not present

## 2017-06-30 DIAGNOSIS — J342 Deviated nasal septum: Secondary | ICD-10-CM | POA: Diagnosis not present

## 2017-07-16 ENCOUNTER — Telehealth: Payer: Self-pay | Admitting: Family Medicine

## 2017-07-16 DIAGNOSIS — G3184 Mild cognitive impairment, so stated: Secondary | ICD-10-CM

## 2017-07-16 DIAGNOSIS — R410 Disorientation, unspecified: Secondary | ICD-10-CM

## 2017-07-16 NOTE — Telephone Encounter (Signed)
Pt's daughter, Shauna Hugh calling voicing concern about the pt "losing his place" and not knowing where he is.Pt's daughter wanting to know if this is something that occurred with aging or if something else was going on physiologically with the pt.  Daughter states that recently when she took the pt out,  he got out of the car and walked in the opposite direction of the building. Pt's daughter also states that the pt stays with her brother and last night and the pt had an episode of incontinence of both bowel and bladder in the kitchen floor. Pt's daughter feels like they are running out of options and states that the nursing home the pt prefers to go to has no beds available and it is going to take approximately 3 more weeks for medicare to complete paperwork. Informed pt's daughter that he would need to be seen in for an appt and no appt availability for today or tomorrow. Next appt available with Dr. Darnell Level is a same day appt on Monday. Informed pt's daughter to call the office back in the am to see if there has been a cancellation. Pt prefers to stick with Dr. Darnell Level and not be seen by another provider.

## 2017-07-16 NOTE — Telephone Encounter (Addendum)
This sounds like what is to be expected with his ongoing memory troubles.  Does not necessarily need office visit for eval. Would be reasonable to check head CT given progressive memory trouble however - let me know if they'd like to do this. Do they have personal care assistant/aide to help at home?

## 2017-07-17 ENCOUNTER — Telehealth: Payer: Self-pay | Admitting: *Deleted

## 2017-07-17 NOTE — Telephone Encounter (Signed)
Spoke with Diane relaying message per Dr. Oneita Jolly pt's caregiver may already have left his house but she will see. Also, Diane will not be able to get pt here until Tues, so scheduled pt for Tues, 1/8 @ 8:00 AM.  Also aware of head CT order. Fwd to Dr. Darnell Level as Juluis Rainier.

## 2017-07-17 NOTE — Addendum Note (Signed)
Addended by: Ria Bush on: 07/17/2017 01:54 PM   Modules accepted: Orders

## 2017-07-17 NOTE — Telephone Encounter (Signed)
Copied from [place] [phone]. Topic: General - Other >> Jul 17, 2017  9:11 AM Vernona Rieger wrote: Pt's daughter Shauna Hugh called and stated that her dad is declining in the last ten days. She said he is doing things different, She stated that he pooped in the kitchen the other night. She said she really wanted to bring him in but there was nothing available in the time frame she needed. She said her and Dr. Darnell Level have discussed putting him in the nursing home. She said things are stressful at home. She went to see him Wednesday & he is confused. He couldn't find his way to the restroom when they went out to eat. Incontinence is getting really bad she says. Please call her back @ [phone]

## 2017-07-17 NOTE — Telephone Encounter (Addendum)
plz have them drop off urine sample for testing today (urinalysis and culture if abnormal), schedule Monday appt for follow up.  Head CT ordered.

## 2017-07-17 NOTE — Telephone Encounter (Signed)
Spoke with pt's daughter, Shauna Hugh (on dpr), relaying message per Dr. Oneita Jolly pt lives with son and daughter-in-law but no outside help. Agrees to having head CT done.  However, her main concern is pt's disorientation, which has worsened within the last week.

## 2017-07-21 ENCOUNTER — Ambulatory Visit (INDEPENDENT_AMBULATORY_CARE_PROVIDER_SITE_OTHER)
Admission: RE | Admit: 2017-07-21 | Discharge: 2017-07-21 | Disposition: A | Discharge status: Home / Self Care | Payer: Medicare Other | Source: Ambulatory Visit | Attending: Family Medicine | Admitting: Family Medicine

## 2017-07-21 ENCOUNTER — Encounter: Payer: Self-pay | Admitting: Family Medicine

## 2017-07-21 ENCOUNTER — Ambulatory Visit: Payer: Medicare Other | Admitting: Family Medicine

## 2017-07-21 VITALS — BP 118/60 | HR 83 | Temp 97.8°F | Wt 151.0 lb

## 2017-07-21 DIAGNOSIS — I10 Essential (primary) hypertension: Secondary | ICD-10-CM

## 2017-07-21 DIAGNOSIS — R32 Unspecified urinary incontinence: Secondary | ICD-10-CM | POA: Diagnosis not present

## 2017-07-21 DIAGNOSIS — R634 Abnormal weight loss: Secondary | ICD-10-CM

## 2017-07-21 DIAGNOSIS — R4182 Altered mental status, unspecified: Secondary | ICD-10-CM

## 2017-07-21 DIAGNOSIS — G3184 Mild cognitive impairment, so stated: Secondary | ICD-10-CM

## 2017-07-21 DIAGNOSIS — R159 Full incontinence of feces: Secondary | ICD-10-CM | POA: Diagnosis not present

## 2017-07-21 DIAGNOSIS — H9193 Unspecified hearing loss, bilateral: Secondary | ICD-10-CM

## 2017-07-21 DIAGNOSIS — R413 Other amnesia: Secondary | ICD-10-CM | POA: Diagnosis not present

## 2017-07-21 DIAGNOSIS — R410 Disorientation, unspecified: Secondary | ICD-10-CM | POA: Diagnosis not present

## 2017-07-21 DIAGNOSIS — N184 Chronic kidney disease, stage 4 (severe): Secondary | ICD-10-CM

## 2017-07-21 LAB — POC URINALSYSI DIPSTICK (AUTOMATED)
Bilirubin, UA: NEGATIVE
Blood, UA: NEGATIVE
Glucose, UA: NEGATIVE
Ketones, UA: NEGATIVE
Leukocytes, UA: NEGATIVE
Nitrite, UA: NEGATIVE
PH UA: 6 (ref 5.0–8.0)
PROTEIN UA: NEGATIVE
SPEC GRAV UA: 1.015 (ref 1.010–1.025)
Urobilinogen, UA: 0.2 E.U./dL

## 2017-07-21 MED ORDER — HYDROCHLOROTHIAZIDE 12.5 MG PO TABS: 12.5000 mg | ORAL_TABLET | Freq: Every morning | ORAL | 6 refills | Status: AC

## 2017-07-21 NOTE — Progress Notes (Signed)
BP 118/60 (BP Location: Right Arm, Cuff Size: Normal)   Pulse 83   Temp 97.8 F (36.6 C) (Oral)   Wt 151 lb (68.5 kg)   SpO2 96%   BMI 24.56 kg/m    CC: declining mental state Subjective:    Patient ID: Ronnie Brown, male    DOB: July 19, 1922, 84 y.o.   MRN: [id number]  HPI: Ronnie Brown is a 84 y.o. male presenting on 07/21/2017 for Follow-up (Daughter, Shauna Hugh, is concerned about pt's declining mental state and since of orientation)   Here with daughter Shauna Hugh. Lives with son and DIL.   Last 2 weeks noticing increased disorientation - mistook kitchen for bathroom and had bowel/bladder accident in kitchen. Wakes up at night and is confused, looking around with door open. Gets lost in the house or at [place] gone to for years. He does not drive.  Caregiver has also noticed decline.  Steady on his feet. No falls or near falls. No longer using cane.  No fevers/chills, cough, skin rash or infection, diarrhea. No pain endorsed.  In process of applying for medicaid.  Trouble at night time - wakes up, opens house door and stares out the door.   Relevant past medical, surgical, family and social history reviewed and updated as indicated. Interim medical history since our last visit reviewed. Allergies and medications reviewed and updated. Outpatient Medications Prior to Visit  Medication Sig Dispense Refill  . acetaminophen (TYLENOL) 500 MG tablet Take 500 mg by mouth as needed.      Marland Kitchen lisinopril (PRINIVIL,ZESTRIL) 20 MG tablet Take 1 tablet (20 mg total) by mouth daily. 30 tablet 6  . sertraline (ZOLOFT) 25 MG tablet Take 1 tablet (25 mg total) by mouth daily. 30 tablet 6  . tamsulosin (FLOMAX) 0.4 MG CAPS capsule Take 1 capsule (0.4 mg total) by mouth daily. 30 capsule 6  . hydrochlorothiazide (HYDRODIURIL) 25 MG tablet Take 1 tablet (25 mg total) by mouth every morning. 30 tablet 6   No facility-administered medications prior to visit.      Per HPI unless specifically  indicated in ROS section below Review of Systems     Objective:    BP 118/60 (BP Location: Right Arm, Cuff Size: Normal)   Pulse 83   Temp 97.8 F (36.6 C) (Oral)   Wt 151 lb (68.5 kg)   SpO2 96%   BMI 24.56 kg/m   Wt Readings from Last 3 Encounters:  07/21/17 151 lb (68.5 kg)  06/02/17 149 lb (67.6 kg)  03/31/17 147 lb (66.7 kg)    BP Readings from Last 3 Encounters:  07/21/17 118/60  06/02/17 116/60  03/31/17 (!) 100/58    Physical Exam  Constitutional: He appears well-developed and well-nourished. No distress.  HENT:  Mouth/Throat: Oropharynx is clear and moist. No oropharyngeal exudate.  Eyes: Conjunctivae are normal. Pupils are equal, round, and reactive to light.  Cardiovascular: Normal rate, regular rhythm, normal heart sounds and intact distal pulses.  No murmur heard. Pulmonary/Chest: Effort normal and breath sounds normal. No respiratory distress. He has no wheezes. He has no rales.  Musculoskeletal: He exhibits no edema.  Skin: Skin is warm and dry. No rash noted.  Psychiatric: He has a normal mood and affect.  Nursing note and vitals reviewed.  Results for orders placed or performed in visit on 07/21/17  POCT Urinalysis Dipstick (Automated)  Result Value Ref Range   Color, UA yellow    Clarity, UA clear    Glucose,  UA negative    Bilirubin, UA negative    Ketones, UA negative    Spec Grav, UA 1.015 1.010 - 1.025   Blood, UA negative    pH, UA 6.0 5.0 - 8.0   Protein, UA negative    Urobilinogen, UA 0.2 0.2 or 1.0 E.U./dL   Nitrite, UA negative    Leukocytes, UA Negative Negative      Assessment & Plan:   Problem List Items Addressed This Visit    Bowel and bladder incontinence    Bowel > bladder. Main struggle at home. Pending ALF transition.       CKD (chronic kidney disease) stage 4, GFR 15-29 ml/min (HCC)    Consider recheck labs next visit.       Essential hypertension    Chronic, stable. Will continue titration of hctz anticipate not  as effective given CKD.       Relevant Medications   hydrochlorothiazide (HYDRODIURIL) 12.5 MG tablet   Hearing impairment    Chronic, severe despite hearing aide in R ear.      MCI (mild cognitive impairment) with memory loss - Primary    Ongoing progression with noted disorientation in last 2 weeks. No focal signs for infection, UA today normal. anticipate progression of MCI. Pt and family are hopeful to be able to transition to ALF - awaiting bed availability (pt is 12th on waiting list). They have hired caregivers several days a week during the day and now also at night.  Pending head CT later today.       Weight loss, unintentional    Another 2 lbs weight gain noted - attributed to caregivers being more cognisant of regular meal participation.        Other Visit Diagnoses    Altered mental status, unspecified altered mental status type       Relevant Orders   POCT Urinalysis Dipstick (Automated) (Completed)       Follow up plan: Return if symptoms worsen or fail to improve.  Ria Bush, MD

## 2017-07-21 NOTE — Assessment & Plan Note (Signed)
Chronic, severe despite hearing aide in R ear.

## 2017-07-21 NOTE — Assessment & Plan Note (Signed)
Bowel > bladder. Main struggle at home. Pending ALF transition.

## 2017-07-21 NOTE — Assessment & Plan Note (Signed)
Consider recheck labs next visit.

## 2017-07-21 NOTE — Patient Instructions (Signed)
Urine looking ok today. We will await head CT. Let's decrease hydrochlorothiazide (water pill) to 12.5mg  daily (may cut in half until you run out, new dose at pharmacy) I think symptoms we're noticing may be progression of illness.

## 2017-07-21 NOTE — Assessment & Plan Note (Signed)
Another 2 lbs weight gain noted - attributed to caregivers being more cognisant of regular meal participation.

## 2017-07-21 NOTE — Assessment & Plan Note (Signed)
Chronic, stable. Will continue titration of hctz anticipate not as effective given CKD.

## 2017-07-21 NOTE — Assessment & Plan Note (Addendum)
Ongoing progression with noted disorientation in last 2 weeks. No focal signs for infection, UA today normal. anticipate progression of MCI. Pt and family are hopeful to be able to transition to ALF - awaiting bed availability (pt is 12th on waiting list). They have hired caregivers several days a week during the day and now also at night.  Pending head CT later today.

## 2017-07-28 ENCOUNTER — Telehealth: Payer: Self-pay | Admitting: Family Medicine

## 2017-07-28 DIAGNOSIS — N184 Chronic kidney disease, stage 4 (severe): Secondary | ICD-10-CM | POA: Diagnosis not present

## 2017-07-28 DIAGNOSIS — G300 Alzheimer's disease with early onset: Secondary | ICD-10-CM | POA: Diagnosis not present

## 2017-07-28 DIAGNOSIS — I10 Essential (primary) hypertension: Secondary | ICD-10-CM | POA: Diagnosis not present

## 2017-07-28 DIAGNOSIS — H9193 Unspecified hearing loss, bilateral: Secondary | ICD-10-CM | POA: Diagnosis not present

## 2017-07-28 DIAGNOSIS — J449 Chronic obstructive pulmonary disease, unspecified: Secondary | ICD-10-CM | POA: Diagnosis not present

## 2017-07-28 DIAGNOSIS — G3184 Mild cognitive impairment, so stated: Secondary | ICD-10-CM | POA: Diagnosis not present

## 2017-07-28 NOTE — Telephone Encounter (Signed)
CRM for notification. See Telephone encounter for:   07/28/17.  Caller name:Diane Kirkman  Relation to pt: daughter  Call back number: [phone]    Reason for call:  Daughter checking on the status of FL2 form, daughter states [facility] faxed over form yesterday 07/27/17 and would like patient to move in today.   Assisting Living requesting FL2 form reflecting updated date and requesting current med list, please advise

## 2017-07-28 NOTE — Telephone Encounter (Signed)
This was filled out yesterday. Should have been faxed today.

## 2017-07-29 NOTE — Telephone Encounter (Signed)
Noted  

## 2017-07-31 DIAGNOSIS — J449 Chronic obstructive pulmonary disease, unspecified: Secondary | ICD-10-CM | POA: Diagnosis not present

## 2017-07-31 DIAGNOSIS — G300 Alzheimer's disease with early onset: Secondary | ICD-10-CM | POA: Diagnosis not present

## 2017-07-31 DIAGNOSIS — I10 Essential (primary) hypertension: Secondary | ICD-10-CM | POA: Diagnosis not present

## 2017-07-31 DIAGNOSIS — N184 Chronic kidney disease, stage 4 (severe): Secondary | ICD-10-CM | POA: Diagnosis not present

## 2017-08-07 DIAGNOSIS — H9193 Unspecified hearing loss, bilateral: Secondary | ICD-10-CM | POA: Diagnosis not present

## 2017-08-07 DIAGNOSIS — N184 Chronic kidney disease, stage 4 (severe): Secondary | ICD-10-CM | POA: Diagnosis not present

## 2017-08-07 DIAGNOSIS — G3184 Mild cognitive impairment, so stated: Secondary | ICD-10-CM | POA: Diagnosis not present

## 2017-08-07 DIAGNOSIS — J449 Chronic obstructive pulmonary disease, unspecified: Secondary | ICD-10-CM | POA: Diagnosis not present

## 2017-08-07 DIAGNOSIS — I10 Essential (primary) hypertension: Secondary | ICD-10-CM | POA: Diagnosis not present

## 2017-08-10 DIAGNOSIS — G3184 Mild cognitive impairment, so stated: Secondary | ICD-10-CM | POA: Diagnosis not present

## 2017-08-10 DIAGNOSIS — I10 Essential (primary) hypertension: Secondary | ICD-10-CM | POA: Diagnosis not present

## 2017-08-10 DIAGNOSIS — H9190 Unspecified hearing loss, unspecified ear: Secondary | ICD-10-CM | POA: Diagnosis not present

## 2017-08-21 ENCOUNTER — Telehealth: Payer: Self-pay | Admitting: Family Medicine

## 2017-08-21 NOTE — Telephone Encounter (Signed)
We are working on new FL2.

## 2017-08-21 NOTE — Telephone Encounter (Signed)
Faxed FL2 form to Kristen's attn at [phone].

## 2017-08-21 NOTE — Telephone Encounter (Signed)
Shana at [facility] called.  Cyril Mourning at [facility] was on the phone and patient is at [facility].  She can't admit the patient until he has an updated FL-2. I spoke to Dr.G and he said he would do an FL-2 form today.  Cyril Mourning wanted to make sure Dr.G specifies [facility] to [facility].  FL-2 form can be faxed to [phone] ATTN: Kristen.  Shana let Cyril Mourning know that she would receive FL-2 by the end of the day.

## 2017-09-01 ENCOUNTER — Ambulatory Visit: Payer: Medicare Other | Admitting: Family Medicine

## 2017-09-23 DIAGNOSIS — I10 Essential (primary) hypertension: Secondary | ICD-10-CM | POA: Diagnosis not present

## 2017-09-23 DIAGNOSIS — N400 Enlarged prostate without lower urinary tract symptoms: Secondary | ICD-10-CM | POA: Diagnosis not present

## 2017-09-23 DIAGNOSIS — G3184 Mild cognitive impairment, so stated: Secondary | ICD-10-CM | POA: Diagnosis not present

## 2017-09-28 DIAGNOSIS — Q845 Enlarged and hypertrophic nails: Secondary | ICD-10-CM | POA: Diagnosis not present

## 2017-09-28 DIAGNOSIS — I739 Peripheral vascular disease, unspecified: Secondary | ICD-10-CM | POA: Diagnosis not present

## 2017-09-28 DIAGNOSIS — L603 Nail dystrophy: Secondary | ICD-10-CM | POA: Diagnosis not present

## 2017-09-28 DIAGNOSIS — B351 Tinea unguium: Secondary | ICD-10-CM | POA: Diagnosis not present

## 2017-10-09 ENCOUNTER — Emergency Department (HOSPITAL_COMMUNITY): Payer: Medicare Other

## 2017-10-09 ENCOUNTER — Encounter (HOSPITAL_COMMUNITY): Payer: Self-pay | Admitting: Obstetrics and Gynecology

## 2017-10-09 ENCOUNTER — Emergency Department (HOSPITAL_COMMUNITY)
Admission: EM | Admit: 2017-10-09 | Discharge: 2017-10-09 | Disposition: A | Discharge status: Home / Self Care | Payer: Medicare Other | Attending: Emergency Medicine | Admitting: Emergency Medicine

## 2017-10-09 ENCOUNTER — Other Ambulatory Visit: Payer: Self-pay

## 2017-10-09 DIAGNOSIS — Z79899 Other long term (current) drug therapy: Secondary | ICD-10-CM | POA: Diagnosis not present

## 2017-10-09 DIAGNOSIS — J069 Acute upper respiratory infection, unspecified: Secondary | ICD-10-CM | POA: Diagnosis not present

## 2017-10-09 DIAGNOSIS — N184 Chronic kidney disease, stage 4 (severe): Secondary | ICD-10-CM | POA: Diagnosis not present

## 2017-10-09 DIAGNOSIS — J449 Chronic obstructive pulmonary disease, unspecified: Secondary | ICD-10-CM | POA: Insufficient documentation

## 2017-10-09 DIAGNOSIS — G3184 Mild cognitive impairment, so stated: Secondary | ICD-10-CM | POA: Diagnosis not present

## 2017-10-09 DIAGNOSIS — Z87891 Personal history of nicotine dependence: Secondary | ICD-10-CM | POA: Insufficient documentation

## 2017-10-09 DIAGNOSIS — I129 Hypertensive chronic kidney disease with stage 1 through stage 4 chronic kidney disease, or unspecified chronic kidney disease: Secondary | ICD-10-CM | POA: Diagnosis not present

## 2017-10-09 DIAGNOSIS — R0902 Hypoxemia: Secondary | ICD-10-CM | POA: Diagnosis not present

## 2017-10-09 DIAGNOSIS — R05 Cough: Secondary | ICD-10-CM | POA: Diagnosis not present

## 2017-10-09 DIAGNOSIS — I493 Ventricular premature depolarization: Secondary | ICD-10-CM | POA: Diagnosis not present

## 2017-10-09 DIAGNOSIS — I10 Essential (primary) hypertension: Secondary | ICD-10-CM | POA: Diagnosis not present

## 2017-10-09 DIAGNOSIS — R0981 Nasal congestion: Secondary | ICD-10-CM | POA: Insufficient documentation

## 2017-10-09 LAB — COMPREHENSIVE METABOLIC PANEL
ALBUMIN: 3 g/dL — AB (ref 3.5–5.0)
ALK PHOS: 70 U/L (ref 38–126)
ALT: 16 U/L — ABNORMAL LOW (ref 17–63)
ANION GAP: 10 (ref 5–15)
AST: 16 U/L (ref 15–41)
BILIRUBIN TOTAL: 0.9 mg/dL (ref 0.3–1.2)
BUN: 53 mg/dL — AB (ref 6–20)
CO2: 27 mmol/L (ref 22–32)
Calcium: 8.6 mg/dL — ABNORMAL LOW (ref 8.9–10.3)
Chloride: 105 mmol/L (ref 101–111)
Creatinine, Ser: 1.89 mg/dL — ABNORMAL HIGH (ref 0.61–1.24)
GFR calc Af Amer: 34 mL/min — ABNORMAL LOW (ref >60)
GFR calc non Af Amer: 29 mL/min — ABNORMAL LOW (ref >60)
GLUCOSE: 122 mg/dL — AB (ref 65–99)
POTASSIUM: 4 mmol/L (ref 3.5–5.1)
SODIUM: 142 mmol/L (ref 135–145)
TOTAL PROTEIN: 6.7 g/dL (ref 6.5–8.1)

## 2017-10-09 LAB — CBC
HEMATOCRIT: 33.4 % — AB (ref 39.0–52.0)
Hemoglobin: 10.7 g/dL — ABNORMAL LOW (ref 13.0–17.0)
MCH: 30.9 pg (ref 26.0–34.0)
MCHC: 32 g/dL (ref 30.0–36.0)
MCV: 96.5 fL (ref 78.0–100.0)
Platelets: 242 10*3/uL (ref 150–400)
RBC: 3.46 MIL/uL — ABNORMAL LOW (ref 4.22–5.81)
RDW: 13.3 % (ref 11.5–15.5)
WBC: 13.6 10*3/uL — AB (ref 4.0–10.5)

## 2017-10-09 LAB — I-STAT TROPONIN, ED: Troponin i, poc: 0.01 ng/mL (ref 0.00–0.08)

## 2017-10-09 LAB — BRAIN NATRIURETIC PEPTIDE: B NATRIURETIC PEPTIDE 5: 273.5 pg/mL — AB (ref 0.0–100.0)

## 2017-10-09 MED ORDER — IPRATROPIUM-ALBUTEROL 0.5-2.5 (3) MG/3ML IN SOLN
3.0000 mL | Freq: Once | RESPIRATORY_TRACT | Status: AC
  Administered 2017-10-09: 3 mL via RESPIRATORY_TRACT
  Filled 2017-10-09: qty 3

## 2017-10-09 MED ORDER — BENZONATATE 100 MG PO CAPS: 100.0000 mg | ORAL_CAPSULE | Freq: Three times a day (TID) | ORAL | 0 refills | Status: AC

## 2017-10-09 MED ORDER — SODIUM CHLORIDE 0.9 % IV BOLUS
500.0000 mL | Freq: Once | INTRAVENOUS | Status: AC
  Administered 2017-10-09: 500 mL via INTRAVENOUS

## 2017-10-09 MED ORDER — DOXYCYCLINE HYCLATE 100 MG PO TABS
100.0000 mg | ORAL_TABLET | Freq: Once | ORAL | Status: AC
  Administered 2017-10-09: 100 mg via ORAL
  Filled 2017-10-09: qty 1

## 2017-10-09 MED ORDER — DOXYCYCLINE HYCLATE 100 MG PO CAPS: 100.0000 mg | ORAL_CAPSULE | Freq: Two times a day (BID) | ORAL | 0 refills | Status: AC

## 2017-10-09 NOTE — ED Triage Notes (Signed)
Per EMS: Pt is from [facility].  Facility reports pt has a runny nose and cough.  Pt reports no complaints.  Pt is at baseline orientation per facility with no increased confusion.  Pt's lung sounds were clear.  Pt has a hx of COPD.  Pt is 90-91% on RA, no increase WOB.  Pt has yellow sputum.  Pt is full code and has no known allergies.

## 2017-10-09 NOTE — Discharge Instructions (Addendum)
Take medications as prescribed, follow-up with your primary care doctor next week to be rechecked, return as needed for worsening symptoms

## 2017-10-09 NOTE — ED Notes (Signed)
His son Babs Sciara [phone] would like an update on his dad when we know more

## 2017-10-09 NOTE — ED Notes (Signed)
Bed: WA11 Expected date:  Expected time:  Means of arrival:  Comments: 

## 2017-10-09 NOTE — ED Notes (Signed)
Pt was able to ambulate successfully 20 feet

## 2017-10-09 NOTE — ED Provider Notes (Signed)
[facility] DEPT Provider Note   CSN: [id number] Arrival date & time: 10/09/17  1755    Hx limited, pt is very hard of hearing, hard to get him to understand me History   Chief Complaint Chief Complaint  Patient presents with  . Cough  . Nasal Congestion    HPI Ronnie Brown is a 84 y.o. male.  HPI Pt sent from his nursing facility.  Pt has been coughing for several days.  He also has had a runny nose.  No known fever.  Pt denies any chest pain.  Pt is bringing up yellow sputum.  His o2 sat was in the low 90s this evening so he was sent to the ED.  No vomiting or diarrhea.  Pt denies shortness of breath.  No leg swelling.  Past Medical History:  Diagnosis Date  . BPH (benign prostatic hyperplasia)    s/p TURP 2006  . 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 CKD (chronic kidney disease) stage 3, GFR 30-59 ml/min ([place]) 01/27/2007  . COPD (chronic obstructive pulmonary disease) ([place])    by xray  . Hearing impairment    bilateral severe sensori-neural, hearing aids, last audiology eval 06/2011  ([facility])  . Hypertension   . Kidney stone 03/2011   obstructive R upper ureteral stone s/p stent placement/ureteral dilatation, stent removal, likely passed.  remaining 3 mm R lower pole stone asxs  . Macular degeneration 12/13/2012  . Solitary lung nodule 03/2011   24mm R lung base, stable on rpt CT 10/2011, consider rpt 2 yrs  . Squamous cell carcinoma 04/2016   invasive, mid crown scalp Allyson Sabal)    Patient Active Problem List   Diagnosis Date Noted  . Bowel and bladder incontinence 06/03/2017  . Weight loss, unintentional 03/31/2017  . MCI (mild cognitive impairment) with memory loss 03/31/2017  . Normocytic anemia 03/31/2017  . Cancer of left ear   . Health maintenance examination 03/16/2014  . Advanced care planning/counseling discussion 03/16/2014  . Skin lesion of scalp  03/15/2013  . Macular degeneration 12/13/2012  . COPD (chronic obstructive pulmonary disease) ([place]) 07/03/2012  . Medicare annual wellness visit, subsequent 11/05/2011  . Hearing impairment   . BPH (benign prostatic hyperplasia)   . Solitary lung nodule 03/15/2011  . Kidney stone 03/15/2011  . CKD (chronic kidney disease) stage 4, GFR 15-29 ml/min ([facility]) 01/27/2007  . Essential hypertension 01/22/2007    Past Surgical History:  Procedure Laterality Date  . CATARACT EXTRACTION Left 2016  . CATARACT EXTRACTION, BILATERAL  2001/2003   Right 2001, Left laser 2003  . CYSTECTOMY  2002   right upper arm (Dr. Maryruth Bun)  . CYSTOSCOPY  08/19/06   flexible cystoscopy nonobstrutive  . History of ECHO  12/29/04   EF 60%, no wall abnormality  . hospitalization  06/17-06/22/06   [facility], ? pneumonia, urinary retention, gastrocnemia, elevated PSA, ARI  . hospitalization  03/2011   R obstructing ureteral stone s/p cystoscopy, ureteroscopy, R ureteral stent placement, ARF, CKD stage 3, found pulm nodule  . hospitalization  06/2012   COPD exacerbation, PNA  . PATELLA FRACTURE SURGERY  2003   Rehab  . SKIN CANCER EXCISION  08/2015   excision of squamous cell skin cancer from L ear Ernesto Rutherford)  . TRANSURETHRAL RESECTION OF PROSTATE  01/24/05   [place]        Home Medications    Prior to Admission  medications   Medication Sig Start Date End Date Taking? Authorizing Provider  hydrochlorothiazide (HYDRODIURIL) 12.5 MG tablet Take 1 tablet (12.5 mg total) by mouth every morning. 07/21/17  Yes Ria Bush, MD  lisinopril (PRINIVIL,ZESTRIL) 20 MG tablet Take 1 tablet (20 mg total) by mouth daily. 06/02/17  Yes Ria Bush, MD  sertraline (ZOLOFT) 25 MG tablet Take 1 tablet (25 mg total) by mouth daily. 06/02/17  Yes Ria Bush, MD  tamsulosin (FLOMAX) 0.4 MG CAPS capsule Take 1 capsule (0.4 mg total) by mouth daily. 06/02/17  Yes Ria Bush, MD  acetaminophen (TYLENOL) 500 MG  tablet Take 500 mg by mouth as needed.      [provider]  benzonatate (TESSALON) 100 MG capsule Take 1 capsule (100 mg total) by mouth every 8 (eight) hours. 10/09/17   Dorie Rank, MD  doxycycline (VIBRAMYCIN) 100 MG capsule Take 1 capsule (100 mg total) by mouth 2 (two) times daily. 10/09/17   Dorie Rank, MD    Family History Family History  Problem Relation Age of Onset  . Heart disease Mother        heart trouble, arrhythmia  . Diabetes Mother   . Cancer Father        skin in mouth  . Heart disease Sister        CAD  . Diabetes Sister   . Heart disease Brother        A. Fib  . Heart disease Daughter        CAD  . Dementia Daughter   . Hypertension Brother   . Cancer Son        lymphoma  . Stroke Neg Hx     Social History Social History   Tobacco Use  . Smoking status: Former Smoker    Packs/day: 1.50    Years: 30.00    Pack years: 45.00    Last attempt to quit: 07/14/1978    Years since quitting: 39.2  . Smokeless tobacco: Never Used  . Tobacco comment: Quit 1974  Substance Use Topics  . Alcohol use: No  . Drug use: No     Allergies   Patient has no known allergies.   Review of Systems Review of Systems  All other systems reviewed and are negative.    Physical Exam Updated Vital Signs BP (!) 141/54 (BP Location: Right Arm)   Pulse 94   Temp 98.7 F (37.1 C) (Oral)   Resp 18   SpO2 95%   Physical Exam  Constitutional: No distress.  Elderly , frail  HENT:  Head: Normocephalic and atraumatic.  Right Ear: External ear normal.  Left Ear: External ear normal.  Mm dry  Eyes: Conjunctivae are normal. Right eye exhibits no discharge. Left eye exhibits no discharge. No scleral icterus.  Neck: Neck supple. No tracheal deviation present.  Cardiovascular: Normal rate, regular rhythm and intact distal pulses.  Pulmonary/Chest: Effort normal and breath sounds normal. No stridor. No respiratory distress. He has no wheezes. He has no rales.    Frequent coughing  Abdominal: Soft. Bowel sounds are normal. He exhibits no distension. There is no tenderness. There is no rebound and no guarding.  Musculoskeletal: He exhibits no edema or tenderness.  Neurological: He is alert. He has normal strength. No cranial nerve deficit (no facial droop, extraocular movements intact, no slurred speech) or sensory deficit. He exhibits normal muscle tone. He displays no seizure activity. Coordination normal.  Skin: Skin is warm and dry. No rash noted. He is  not diaphoretic.  Psychiatric: He has a normal mood and affect.  Nursing note and vitals reviewed.    ED Treatments / Results  Labs (all labs ordered are listed, but only abnormal results are displayed) Labs Reviewed  CBC - Abnormal; Notable for the following components:      Result Value   WBC 13.6 (*)    RBC 3.46 (*)    Hemoglobin 10.7 (*)    HCT 33.4 (*)    All other components within normal limits  COMPREHENSIVE METABOLIC PANEL - Abnormal; Notable for the following components:   Glucose, Bld 122 (*)    BUN 53 (*)    Creatinine, Ser 1.89 (*)    Calcium 8.6 (*)    Albumin 3.0 (*)    ALT 16 (*)    GFR calc non Af Amer 29 (*)    GFR calc Af Amer 34 (*)    All other components within normal limits  BRAIN NATRIURETIC PEPTIDE - Abnormal; Notable for the following components:   B Natriuretic Peptide 273.5 (*)    All other components within normal limits  I-STAT TROPONIN, ED    EKG EKG Interpretation  Date/Time:  Friday October 09 2017 19:35:59 EDT Ventricular Rate:  76 PR Interval:    QRS Duration: 77 QT Interval:  404 QTC Calculation: 461 R Axis:   76 Text Interpretation:  Sinus rhythm Multiform ventricular premature complexes ST elevation, consider inferior injury Baseline wander in lead(s) III aVL No significant change since last tracing Confirmed by Dorie Rank [id number]) on 10/09/2017 8:17:42 PM   Radiology Dg Chest 2 View  Result Date: 10/09/2017 CLINICAL DATA:  Runny nose,  cough EXAM: CHEST - 2 VIEW COMPARISON:  07/03/2012 FINDINGS: Bilateral diffuse mild interstitial thickening likely chronic. No focal consolidation, pleural effusion or pneumothorax. Stable cardiomediastinal silhouette. No acute osseous abnormality. IMPRESSION: No active cardiopulmonary disease. Bilateral chronic interstitial. Electronically Signed   By: Kathreen Devoid   On: 10/09/2017 20:13    Procedures Procedures (including critical care time)  Medications Ordered in ED Medications  doxycycline (VIBRA-TABS) tablet 100 mg (has no administration in time range)  sodium chloride 0.9 % bolus 500 mL (0 mLs Intravenous Stopped 10/09/17 2016)  ipratropium-albuterol (DUONEB) 0.5-2.5 (3) MG/3ML nebulizer solution 3 mL (3 mLs Nebulization Given 10/09/17 2016)     Initial Impression / Assessment and Plan / ED Course  I have reviewed the triage vital signs and the nursing notes.  Pertinent labs & imaging results that were available during my care of the patient were reviewed by me and considered in my medical decision making (see chart for details).  Clinical Course as of Oct 09 2341  Fri Oct 09, 2017  2136 CXR without pna   [JK]  2200 Pt was able to walk without difficulty   [JK]    Clinical Course User Index [JK] Dorie Rank, MD    Patient presented to the emergency room for evaluation of cough and congestion.  Patient does have a leukocytosis but otherwise is afebrile and appears nontoxic.  No significant signs of dehydration.  Chest x-ray does not show pneumonia.  I suspect the symptoms are related to a bronchitis type infection.  This may be viral but however considering his age and the increased white blood cell count I think is reasonable start him on a course of antibiotics.  Patient appears stable for close outpatient follow-up.  Final Clinical Impressions(s) / ED Diagnoses   Final diagnoses:  Upper respiratory tract infection, unspecified type  ED Discharge Orders        Ordered     benzonatate (TESSALON) 100 MG capsule  Every 8 hours     10/09/17 2138    doxycycline (VIBRAMYCIN) 100 MG capsule  2 times daily     10/09/17 2138       Dorie Rank, MD 10/09/17 [phone]

## 2017-11-06 DIAGNOSIS — G3184 Mild cognitive impairment, so stated: Secondary | ICD-10-CM | POA: Diagnosis not present

## 2017-11-06 DIAGNOSIS — R52 Pain, unspecified: Secondary | ICD-10-CM | POA: Diagnosis not present

## 2017-11-06 DIAGNOSIS — I10 Essential (primary) hypertension: Secondary | ICD-10-CM | POA: Diagnosis not present

## 2017-11-06 DIAGNOSIS — N400 Enlarged prostate without lower urinary tract symptoms: Secondary | ICD-10-CM | POA: Diagnosis not present

## 2017-11-27 DIAGNOSIS — R52 Pain, unspecified: Secondary | ICD-10-CM | POA: Diagnosis not present

## 2017-11-27 DIAGNOSIS — G3184 Mild cognitive impairment, so stated: Secondary | ICD-10-CM | POA: Diagnosis not present

## 2017-11-27 DIAGNOSIS — N400 Enlarged prostate without lower urinary tract symptoms: Secondary | ICD-10-CM | POA: Diagnosis not present

## 2017-11-27 DIAGNOSIS — I10 Essential (primary) hypertension: Secondary | ICD-10-CM | POA: Diagnosis not present

## 2017-12-09 DIAGNOSIS — I10 Essential (primary) hypertension: Secondary | ICD-10-CM | POA: Diagnosis not present

## 2017-12-21 DIAGNOSIS — B351 Tinea unguium: Secondary | ICD-10-CM | POA: Diagnosis not present

## 2017-12-21 DIAGNOSIS — I739 Peripheral vascular disease, unspecified: Secondary | ICD-10-CM | POA: Diagnosis not present

## 2017-12-21 DIAGNOSIS — L603 Nail dystrophy: Secondary | ICD-10-CM | POA: Diagnosis not present

## 2017-12-25 DIAGNOSIS — G3184 Mild cognitive impairment, so stated: Secondary | ICD-10-CM | POA: Diagnosis not present

## 2017-12-25 DIAGNOSIS — R52 Pain, unspecified: Secondary | ICD-10-CM | POA: Diagnosis not present

## 2017-12-25 DIAGNOSIS — I10 Essential (primary) hypertension: Secondary | ICD-10-CM | POA: Diagnosis not present

## 2017-12-25 DIAGNOSIS — N400 Enlarged prostate without lower urinary tract symptoms: Secondary | ICD-10-CM | POA: Diagnosis not present

## 2018-01-09 DIAGNOSIS — I10 Essential (primary) hypertension: Secondary | ICD-10-CM | POA: Diagnosis not present

## 2018-01-09 DIAGNOSIS — G3184 Mild cognitive impairment, so stated: Secondary | ICD-10-CM | POA: Diagnosis not present

## 2018-01-22 DIAGNOSIS — I10 Essential (primary) hypertension: Secondary | ICD-10-CM | POA: Diagnosis not present

## 2018-01-22 DIAGNOSIS — G3184 Mild cognitive impairment, so stated: Secondary | ICD-10-CM | POA: Diagnosis not present

## 2018-01-22 DIAGNOSIS — N400 Enlarged prostate without lower urinary tract symptoms: Secondary | ICD-10-CM | POA: Diagnosis not present

## 2018-01-22 DIAGNOSIS — G301 Alzheimer's disease with late onset: Secondary | ICD-10-CM | POA: Diagnosis not present

## 2018-01-22 DIAGNOSIS — R52 Pain, unspecified: Secondary | ICD-10-CM | POA: Diagnosis not present

## 2018-01-29 DIAGNOSIS — J449 Chronic obstructive pulmonary disease, unspecified: Secondary | ICD-10-CM | POA: Diagnosis not present

## 2018-01-29 DIAGNOSIS — I10 Essential (primary) hypertension: Secondary | ICD-10-CM | POA: Diagnosis not present

## 2018-01-29 DIAGNOSIS — N400 Enlarged prostate without lower urinary tract symptoms: Secondary | ICD-10-CM | POA: Diagnosis not present

## 2018-02-01 DIAGNOSIS — Z79899 Other long term (current) drug therapy: Secondary | ICD-10-CM | POA: Diagnosis not present

## 2018-02-03 DIAGNOSIS — M6281 Muscle weakness (generalized): Secondary | ICD-10-CM | POA: Diagnosis not present

## 2018-02-03 DIAGNOSIS — N184 Chronic kidney disease, stage 4 (severe): Secondary | ICD-10-CM | POA: Diagnosis not present

## 2018-02-03 DIAGNOSIS — R269 Unspecified abnormalities of gait and mobility: Secondary | ICD-10-CM | POA: Diagnosis not present

## 2018-02-03 DIAGNOSIS — I10 Essential (primary) hypertension: Secondary | ICD-10-CM | POA: Diagnosis not present

## 2018-02-04 DIAGNOSIS — M6281 Muscle weakness (generalized): Secondary | ICD-10-CM | POA: Diagnosis not present

## 2018-02-04 DIAGNOSIS — R269 Unspecified abnormalities of gait and mobility: Secondary | ICD-10-CM | POA: Diagnosis not present

## 2018-02-04 DIAGNOSIS — N184 Chronic kidney disease, stage 4 (severe): Secondary | ICD-10-CM | POA: Diagnosis not present

## 2018-02-04 DIAGNOSIS — I10 Essential (primary) hypertension: Secondary | ICD-10-CM | POA: Diagnosis not present

## 2018-02-05 ENCOUNTER — Telehealth: Payer: Self-pay

## 2018-02-05 NOTE — Telephone Encounter (Signed)
Copied from [place] [id number]. Topic: Inquiry >> Feb 05, 2018  1:45 PM Oliver Pila B wrote: Reason for CRM: [facility] in [place] called to get clinical information about the pt; contact [phone] ext [phone]

## 2018-02-05 NOTE — Telephone Encounter (Signed)
I called [facility] which is a nursing facility in [place] and spoke with Moshe Salisbury; Rodena Piety has gone for the day;  I requested that [facility] fax what clinical info is needed to [phone].

## 2018-02-08 DIAGNOSIS — I10 Essential (primary) hypertension: Secondary | ICD-10-CM | POA: Diagnosis not present

## 2018-02-08 DIAGNOSIS — N184 Chronic kidney disease, stage 4 (severe): Secondary | ICD-10-CM | POA: Diagnosis not present

## 2018-02-08 DIAGNOSIS — R269 Unspecified abnormalities of gait and mobility: Secondary | ICD-10-CM | POA: Diagnosis not present

## 2018-02-08 DIAGNOSIS — M6281 Muscle weakness (generalized): Secondary | ICD-10-CM | POA: Diagnosis not present

## 2018-02-11 DIAGNOSIS — N184 Chronic kidney disease, stage 4 (severe): Secondary | ICD-10-CM | POA: Diagnosis not present

## 2018-02-11 DIAGNOSIS — R1312 Dysphagia, oropharyngeal phase: Secondary | ICD-10-CM | POA: Diagnosis not present

## 2018-02-11 DIAGNOSIS — R269 Unspecified abnormalities of gait and mobility: Secondary | ICD-10-CM | POA: Diagnosis not present

## 2018-02-11 DIAGNOSIS — I10 Essential (primary) hypertension: Secondary | ICD-10-CM | POA: Diagnosis not present

## 2018-02-11 DIAGNOSIS — M6281 Muscle weakness (generalized): Secondary | ICD-10-CM | POA: Diagnosis not present

## 2018-02-11 DIAGNOSIS — R2689 Other abnormalities of gait and mobility: Secondary | ICD-10-CM | POA: Diagnosis not present

## 2018-02-15 DIAGNOSIS — R2689 Other abnormalities of gait and mobility: Secondary | ICD-10-CM | POA: Diagnosis not present

## 2018-02-15 DIAGNOSIS — I10 Essential (primary) hypertension: Secondary | ICD-10-CM | POA: Diagnosis not present

## 2018-02-15 DIAGNOSIS — R1312 Dysphagia, oropharyngeal phase: Secondary | ICD-10-CM | POA: Diagnosis not present

## 2018-02-15 DIAGNOSIS — M6281 Muscle weakness (generalized): Secondary | ICD-10-CM | POA: Diagnosis not present

## 2018-02-15 DIAGNOSIS — R269 Unspecified abnormalities of gait and mobility: Secondary | ICD-10-CM | POA: Diagnosis not present

## 2018-02-15 DIAGNOSIS — N184 Chronic kidney disease, stage 4 (severe): Secondary | ICD-10-CM | POA: Diagnosis not present

## 2018-02-16 DIAGNOSIS — I10 Essential (primary) hypertension: Secondary | ICD-10-CM | POA: Diagnosis not present

## 2018-02-16 DIAGNOSIS — N184 Chronic kidney disease, stage 4 (severe): Secondary | ICD-10-CM | POA: Diagnosis not present

## 2018-02-16 DIAGNOSIS — R2689 Other abnormalities of gait and mobility: Secondary | ICD-10-CM | POA: Diagnosis not present

## 2018-02-16 DIAGNOSIS — M6281 Muscle weakness (generalized): Secondary | ICD-10-CM | POA: Diagnosis not present

## 2018-02-16 DIAGNOSIS — R269 Unspecified abnormalities of gait and mobility: Secondary | ICD-10-CM | POA: Diagnosis not present

## 2018-02-16 DIAGNOSIS — R1312 Dysphagia, oropharyngeal phase: Secondary | ICD-10-CM | POA: Diagnosis not present

## 2018-02-17 DIAGNOSIS — I10 Essential (primary) hypertension: Secondary | ICD-10-CM | POA: Diagnosis not present

## 2018-02-17 DIAGNOSIS — R269 Unspecified abnormalities of gait and mobility: Secondary | ICD-10-CM | POA: Diagnosis not present

## 2018-02-17 DIAGNOSIS — M6281 Muscle weakness (generalized): Secondary | ICD-10-CM | POA: Diagnosis not present

## 2018-02-17 DIAGNOSIS — R2689 Other abnormalities of gait and mobility: Secondary | ICD-10-CM | POA: Diagnosis not present

## 2018-02-17 DIAGNOSIS — N184 Chronic kidney disease, stage 4 (severe): Secondary | ICD-10-CM | POA: Diagnosis not present

## 2018-02-17 DIAGNOSIS — R1312 Dysphagia, oropharyngeal phase: Secondary | ICD-10-CM | POA: Diagnosis not present

## 2018-02-18 DIAGNOSIS — M6281 Muscle weakness (generalized): Secondary | ICD-10-CM | POA: Diagnosis not present

## 2018-02-18 DIAGNOSIS — R2689 Other abnormalities of gait and mobility: Secondary | ICD-10-CM | POA: Diagnosis not present

## 2018-02-18 DIAGNOSIS — R269 Unspecified abnormalities of gait and mobility: Secondary | ICD-10-CM | POA: Diagnosis not present

## 2018-02-18 DIAGNOSIS — R1312 Dysphagia, oropharyngeal phase: Secondary | ICD-10-CM | POA: Diagnosis not present

## 2018-02-18 DIAGNOSIS — N184 Chronic kidney disease, stage 4 (severe): Secondary | ICD-10-CM | POA: Diagnosis not present

## 2018-02-18 DIAGNOSIS — I10 Essential (primary) hypertension: Secondary | ICD-10-CM | POA: Diagnosis not present

## 2018-02-19 DIAGNOSIS — R05 Cough: Secondary | ICD-10-CM | POA: Diagnosis not present

## 2018-02-19 DIAGNOSIS — M6281 Muscle weakness (generalized): Secondary | ICD-10-CM | POA: Diagnosis not present

## 2018-02-19 DIAGNOSIS — I10 Essential (primary) hypertension: Secondary | ICD-10-CM | POA: Diagnosis not present

## 2018-02-19 DIAGNOSIS — R269 Unspecified abnormalities of gait and mobility: Secondary | ICD-10-CM | POA: Diagnosis not present

## 2018-02-19 DIAGNOSIS — R2689 Other abnormalities of gait and mobility: Secondary | ICD-10-CM | POA: Diagnosis not present

## 2018-02-19 DIAGNOSIS — R1312 Dysphagia, oropharyngeal phase: Secondary | ICD-10-CM | POA: Diagnosis not present

## 2018-02-19 DIAGNOSIS — N184 Chronic kidney disease, stage 4 (severe): Secondary | ICD-10-CM | POA: Diagnosis not present

## 2018-02-20 DIAGNOSIS — R05 Cough: Secondary | ICD-10-CM | POA: Diagnosis not present

## 2018-02-22 DIAGNOSIS — R05 Cough: Secondary | ICD-10-CM | POA: Diagnosis not present

## 2018-03-04 DIAGNOSIS — N189 Chronic kidney disease, unspecified: Secondary | ICD-10-CM | POA: Diagnosis not present

## 2018-03-06 DIAGNOSIS — N184 Chronic kidney disease, stage 4 (severe): Secondary | ICD-10-CM | POA: Diagnosis not present

## 2018-03-06 DIAGNOSIS — I10 Essential (primary) hypertension: Secondary | ICD-10-CM | POA: Diagnosis not present

## 2018-03-06 DIAGNOSIS — R1312 Dysphagia, oropharyngeal phase: Secondary | ICD-10-CM | POA: Diagnosis not present

## 2018-03-06 DIAGNOSIS — R2689 Other abnormalities of gait and mobility: Secondary | ICD-10-CM | POA: Diagnosis not present

## 2018-03-06 DIAGNOSIS — R269 Unspecified abnormalities of gait and mobility: Secondary | ICD-10-CM | POA: Diagnosis not present

## 2018-03-06 DIAGNOSIS — M6281 Muscle weakness (generalized): Secondary | ICD-10-CM | POA: Diagnosis not present

## 2018-03-06 DIAGNOSIS — I5030 Unspecified diastolic (congestive) heart failure: Secondary | ICD-10-CM | POA: Diagnosis not present

## 2018-03-08 DIAGNOSIS — I10 Essential (primary) hypertension: Secondary | ICD-10-CM | POA: Diagnosis not present

## 2018-03-08 DIAGNOSIS — R1312 Dysphagia, oropharyngeal phase: Secondary | ICD-10-CM | POA: Diagnosis not present

## 2018-03-08 DIAGNOSIS — M6281 Muscle weakness (generalized): Secondary | ICD-10-CM | POA: Diagnosis not present

## 2018-03-08 DIAGNOSIS — R269 Unspecified abnormalities of gait and mobility: Secondary | ICD-10-CM | POA: Diagnosis not present

## 2018-03-08 DIAGNOSIS — R2689 Other abnormalities of gait and mobility: Secondary | ICD-10-CM | POA: Diagnosis not present

## 2018-03-08 DIAGNOSIS — N184 Chronic kidney disease, stage 4 (severe): Secondary | ICD-10-CM | POA: Diagnosis not present

## 2018-03-10 DIAGNOSIS — R269 Unspecified abnormalities of gait and mobility: Secondary | ICD-10-CM | POA: Diagnosis not present

## 2018-03-10 DIAGNOSIS — R2689 Other abnormalities of gait and mobility: Secondary | ICD-10-CM | POA: Diagnosis not present

## 2018-03-10 DIAGNOSIS — M6281 Muscle weakness (generalized): Secondary | ICD-10-CM | POA: Diagnosis not present

## 2018-03-10 DIAGNOSIS — I10 Essential (primary) hypertension: Secondary | ICD-10-CM | POA: Diagnosis not present

## 2018-03-10 DIAGNOSIS — R1312 Dysphagia, oropharyngeal phase: Secondary | ICD-10-CM | POA: Diagnosis not present

## 2018-03-10 DIAGNOSIS — N184 Chronic kidney disease, stage 4 (severe): Secondary | ICD-10-CM | POA: Diagnosis not present

## 2018-11-22 DIAGNOSIS — T17908A Unspecified foreign body in respiratory tract, part unspecified causing other injury, initial encounter: Secondary | ICD-10-CM

## 2018-11-22 DIAGNOSIS — R0602 Shortness of breath: Secondary | ICD-10-CM

## 2018-11-22 DIAGNOSIS — R7989 Other specified abnormal findings of blood chemistry: Secondary | ICD-10-CM

## 2018-11-22 DIAGNOSIS — R100 Acute abdomen: Secondary | ICD-10-CM

## 2018-11-22 DIAGNOSIS — N179 Acute kidney failure, unspecified: Secondary | ICD-10-CM

## 2018-11-22 DIAGNOSIS — E872 Acidosis: Secondary | ICD-10-CM

## 2018-11-22 DIAGNOSIS — F0390 Unspecified dementia without behavioral disturbance: Secondary | ICD-10-CM | POA: Diagnosis not present

## 2018-11-22 DIAGNOSIS — E860 Dehydration: Secondary | ICD-10-CM

## 2018-11-22 DIAGNOSIS — J9601 Acute respiratory failure with hypoxia: Secondary | ICD-10-CM

## 2018-11-23 DIAGNOSIS — R7989 Other specified abnormal findings of blood chemistry: Secondary | ICD-10-CM | POA: Diagnosis not present

## 2018-11-23 DIAGNOSIS — N179 Acute kidney failure, unspecified: Secondary | ICD-10-CM | POA: Diagnosis not present

## 2018-11-23 DIAGNOSIS — E872 Acidosis: Secondary | ICD-10-CM | POA: Diagnosis not present

## 2018-11-23 DIAGNOSIS — J9601 Acute respiratory failure with hypoxia: Secondary | ICD-10-CM | POA: Diagnosis not present

## 2018-11-24 DIAGNOSIS — J9601 Acute respiratory failure with hypoxia: Secondary | ICD-10-CM | POA: Diagnosis not present

## 2018-11-24 DIAGNOSIS — E872 Acidosis: Secondary | ICD-10-CM | POA: Diagnosis not present

## 2018-11-24 DIAGNOSIS — R7989 Other specified abnormal findings of blood chemistry: Secondary | ICD-10-CM | POA: Diagnosis not present

## 2018-11-24 DIAGNOSIS — N179 Acute kidney failure, unspecified: Secondary | ICD-10-CM | POA: Diagnosis not present

## 2018-11-25 DIAGNOSIS — J9601 Acute respiratory failure with hypoxia: Secondary | ICD-10-CM | POA: Diagnosis not present

## 2018-11-25 DIAGNOSIS — N179 Acute kidney failure, unspecified: Secondary | ICD-10-CM | POA: Diagnosis not present

## 2018-11-25 DIAGNOSIS — R7989 Other specified abnormal findings of blood chemistry: Secondary | ICD-10-CM | POA: Diagnosis not present

## 2018-11-25 DIAGNOSIS — E872 Acidosis: Secondary | ICD-10-CM | POA: Diagnosis not present

## 2018-11-26 DIAGNOSIS — R7989 Other specified abnormal findings of blood chemistry: Secondary | ICD-10-CM | POA: Diagnosis not present

## 2018-11-26 DIAGNOSIS — E872 Acidosis: Secondary | ICD-10-CM | POA: Diagnosis not present

## 2018-11-26 DIAGNOSIS — N179 Acute kidney failure, unspecified: Secondary | ICD-10-CM | POA: Diagnosis not present

## 2018-11-26 DIAGNOSIS — J9601 Acute respiratory failure with hypoxia: Secondary | ICD-10-CM | POA: Diagnosis not present

## 2018-12-28 DIAGNOSIS — F0390 Unspecified dementia without behavioral disturbance: Secondary | ICD-10-CM

## 2018-12-28 DIAGNOSIS — I4891 Unspecified atrial fibrillation: Secondary | ICD-10-CM | POA: Diagnosis not present

## 2018-12-28 DIAGNOSIS — J189 Pneumonia, unspecified organism: Secondary | ICD-10-CM

## 2018-12-28 DIAGNOSIS — A419 Sepsis, unspecified organism: Secondary | ICD-10-CM | POA: Diagnosis not present

## 2018-12-28 DIAGNOSIS — N179 Acute kidney failure, unspecified: Secondary | ICD-10-CM

## 2018-12-28 DIAGNOSIS — J9601 Acute respiratory failure with hypoxia: Secondary | ICD-10-CM

## 2019-01-12 DEATH — deceased
